# Patient Record
Sex: Male | Born: 1955 | Race: White | Hispanic: No | Smoking: Never smoker
Health system: Southern US, Community
[De-identification: ages and names within clinical notes are randomized; demographics above are authoritative.]

## PROBLEM LIST (undated history)

## (undated) DIAGNOSIS — Z860100 Personal history of colon polyps, unspecified: Secondary | ICD-10-CM

## (undated) DIAGNOSIS — H9319 Tinnitus, unspecified ear: Secondary | ICD-10-CM

## (undated) DIAGNOSIS — F419 Anxiety disorder, unspecified: Secondary | ICD-10-CM

## (undated) DIAGNOSIS — R42 Dizziness and giddiness: Secondary | ICD-10-CM

## (undated) DIAGNOSIS — G039 Meningitis, unspecified: Secondary | ICD-10-CM

## (undated) DIAGNOSIS — T7840XA Allergy, unspecified, initial encounter: Secondary | ICD-10-CM

## (undated) DIAGNOSIS — R450 Nervousness: Secondary | ICD-10-CM

## (undated) DIAGNOSIS — R51 Headache: Secondary | ICD-10-CM

## (undated) DIAGNOSIS — K219 Gastro-esophageal reflux disease without esophagitis: Secondary | ICD-10-CM

## (undated) HISTORY — DX: Tinnitus, unspecified ear: H93.19

## (undated) HISTORY — DX: Dizziness and giddiness: R42

## (undated) HISTORY — DX: Anxiety disorder, unspecified: F41.9

## (undated) HISTORY — DX: Personal history of colonic polyps: Z86.010

## (undated) HISTORY — DX: Personal history of colon polyps, unspecified: Z86.0100

## (undated) HISTORY — DX: Nervousness: R45.0

## (undated) HISTORY — DX: Headache: R51

## (undated) HISTORY — PX: WISDOM TOOTH EXTRACTION: SHX21

## (undated) HISTORY — DX: Allergy, unspecified, initial encounter: T78.40XA

## (undated) HISTORY — PX: POLYPECTOMY: SHX149

## (undated) HISTORY — DX: Meningitis, unspecified: G03.9

---

## 2003-01-27 ENCOUNTER — Encounter (INDEPENDENT_AMBULATORY_CARE_PROVIDER_SITE_OTHER): Payer: Self-pay | Admitting: Specialist

## 2003-01-27 ENCOUNTER — Ambulatory Visit (HOSPITAL_COMMUNITY): Admission: RE | Admit: 2003-01-27 | Discharge: 2003-01-27 | Payer: Self-pay | Admitting: Gastroenterology

## 2005-01-22 ENCOUNTER — Ambulatory Visit: Payer: Self-pay | Admitting: Gastroenterology

## 2005-02-04 ENCOUNTER — Ambulatory Visit: Payer: Self-pay | Admitting: Gastroenterology

## 2005-02-04 ENCOUNTER — Encounter: Payer: Self-pay | Admitting: Family Medicine

## 2007-01-27 ENCOUNTER — Encounter: Admission: RE | Admit: 2007-01-27 | Discharge: 2007-01-27 | Payer: Self-pay | Admitting: Orthopedic Surgery

## 2007-03-08 ENCOUNTER — Telehealth: Payer: Self-pay | Admitting: Family Medicine

## 2007-03-10 ENCOUNTER — Ambulatory Visit: Payer: Self-pay | Admitting: Family Medicine

## 2007-03-10 DIAGNOSIS — R51 Headache: Secondary | ICD-10-CM

## 2007-03-10 DIAGNOSIS — R1031 Right lower quadrant pain: Secondary | ICD-10-CM | POA: Insufficient documentation

## 2007-03-10 DIAGNOSIS — Z860100 Personal history of colon polyps, unspecified: Secondary | ICD-10-CM | POA: Insufficient documentation

## 2007-03-10 DIAGNOSIS — R519 Headache, unspecified: Secondary | ICD-10-CM | POA: Insufficient documentation

## 2007-03-12 ENCOUNTER — Encounter: Payer: Self-pay | Admitting: Family Medicine

## 2007-04-05 ENCOUNTER — Encounter: Admission: RE | Admit: 2007-04-05 | Discharge: 2007-04-05 | Payer: Self-pay | Admitting: General Surgery

## 2007-08-16 ENCOUNTER — Telehealth: Payer: Self-pay | Admitting: Family Medicine

## 2008-07-20 ENCOUNTER — Encounter: Payer: Self-pay | Admitting: Family Medicine

## 2009-01-24 ENCOUNTER — Ambulatory Visit: Payer: Self-pay | Admitting: Family Medicine

## 2009-01-24 DIAGNOSIS — N529 Male erectile dysfunction, unspecified: Secondary | ICD-10-CM | POA: Insufficient documentation

## 2009-08-06 ENCOUNTER — Ambulatory Visit: Payer: Self-pay | Admitting: Family Medicine

## 2009-08-06 DIAGNOSIS — J019 Acute sinusitis, unspecified: Secondary | ICD-10-CM | POA: Insufficient documentation

## 2009-12-19 ENCOUNTER — Encounter: Payer: Self-pay | Admitting: Gastroenterology

## 2009-12-25 ENCOUNTER — Encounter (INDEPENDENT_AMBULATORY_CARE_PROVIDER_SITE_OTHER): Payer: Self-pay | Admitting: *Deleted

## 2010-01-24 LAB — HM COLONOSCOPY

## 2010-01-29 ENCOUNTER — Encounter (INDEPENDENT_AMBULATORY_CARE_PROVIDER_SITE_OTHER): Payer: Self-pay | Admitting: *Deleted

## 2010-01-30 ENCOUNTER — Ambulatory Visit: Payer: Self-pay | Admitting: Internal Medicine

## 2010-02-14 ENCOUNTER — Ambulatory Visit: Payer: Self-pay | Admitting: Internal Medicine

## 2010-02-16 ENCOUNTER — Encounter: Payer: Self-pay | Admitting: Internal Medicine

## 2010-02-18 ENCOUNTER — Telehealth (INDEPENDENT_AMBULATORY_CARE_PROVIDER_SITE_OTHER): Payer: Self-pay | Admitting: *Deleted

## 2010-06-25 NOTE — Letter (Signed)
Summary: Patient Notice- Polyp Results  [facility]  [address] [place], [place] [address]   Phone: [phone]  Fax: [phone]        February 16, 2010 MRN: [id number]    [facility] [address] [place], [place]  [address]    Dear Mr. [facility],  I am pleased to inform you that the colon polyp(s) removed during your recent colonoscopy was (were) found to be benign (no cancer detected) upon pathologic examination.The polyp was hyperplastic ( not precancerous)  I recommend you have a repeat colonoscopy examination in 5_ years to look for recurrent polyps, as having colon polyps increases your risk for having recurrent polyps or even colon cancer in the future.  Should you develop new or worsening symptoms of abdominal pain, bowel habit changes or bleeding from the rectum or bowels, please schedule an evaluation with either your primary care physician or with me.  Additional information/recommendations:  _x_ No further action with gastroenterology is needed at this time. Please      follow-up with your primary care physician for your other healthcare      needs.  __ Please call [phone] to schedule a return visit to review your      situation.  __ Please keep your follow-up visit as already scheduled.  __ Continue treatment plan as outlined the day of your exam.  Please call us if you are having persistent problems or have questions about your condition that have not been fully answered at this time.  Sincerely,  Hart Carwin MD  This letter has been electronically signed by your physician.  Appended Document: Patient Notice- Polyp Results letter mailed

## 2010-06-25 NOTE — Procedures (Signed)
Summary: Colonoscopy  Patient: Wesley [name] Note: All result statuses are Final unless otherwise noted.  Tests: (1) Colonoscopy (COL)   COL Colonoscopy           DONE     [facility]     [address]     [place], [place]  [address]           COLONOSCOPY PROCEDURE REPORT           PATIENT:  Wesley [name], Wesley [name]  MR#:  [id number]     BIRTHDATE:  10/08/1955, 54 yrs. old  GENDER:  male     ENDOSCOPIST:  Hedwig Morton. Juanda Chance, MD     REF. BY:  Tera Mater. Clent Ridges, M.D.     PROCEDURE DATE:  02/14/2010     PROCEDURE:  Colonoscopy [address]     ASA CLASS:  Class I     INDICATIONS:  Elevated Risk Screening father with colon cancer     tubovillous adenoma 2004     MEDICATIONS:   Versed 10 mg, Fentanyl 100 mcg           DESCRIPTION OF PROCEDURE:   After the risks benefits and     alternatives of the procedure were thoroughly explained, informed     consent was obtained.  Digital rectal exam was performed and     revealed no rectal masses.   The [id number] endoscope was     introduced through the anus and advanced to the cecum, which was     identified by both the appendix and ileocecal valve, without     limitations.  The quality of the prep was good, using MiraLax.     The instrument was then slowly withdrawn as the colon was fully     examined.     <<PROCEDUREIMAGES>>     FINDINGS:  Two polyps were found. 10 mm sessile polyp outside of     the cecal pouch snared, 3 mm diminutive polyp at 40 cm The polyp     was removed using cold biopsy forceps. Polyp was snared without     cautery. Retrieval was successful (see image5, image6, image8, and     image9). snare polyp  This was otherwise a normal examination of     the colon (see image2, image3, image4, and image10).  Mild     diverticulosis was found (see image1). few scattered dioverticuli     Retroflexed views in the rectum revealed no abnormaliti     es.    The scope was then withdrawn from the patient and the     procedure completed.          COMPLICATIONS:  None     ENDOSCOPIC IMPRESSION:     1) Two polyps     2) Otherwise normal examination     3) Mild diverticulosis     RECOMMENDATIONS:     1) Await pathology results     REPEAT EXAM:  In 5 year(s) for.           ______________________________     Hedwig Morton. Juanda Chance, MD           CC:           n.     eSIGNED:   Hedwig Morton. Teren Franckowiak at 02/14/2010 11:16 AM           Honor Loh, [id number]  Note: An exclamation mark (!) indicates a result that was not dispersed into the flowsheet. Document Creation Date: 02/14/2010  11:17 AM _______________________________________________________________________  (1) Order result status: Final Collection or observation date-time: 02/14/2010 11:04 Requested date-time:  Receipt date-time:  Reported date-time:  Referring Physician:   Ordering Physician: Lina Sar [phone]) Specimen Source:  Source: Launa Grill Order Number: [id number] Lab site:   Appended Document: Colonoscopy recall     Procedures Next Due Date:    Colonoscopy: 01/2015

## 2010-06-25 NOTE — Letter (Signed)
Summary: [facility] Instructions  [facility]  [address] [place], [place] [address]   Phone: [phone]  Fax: [phone]       Wesley [name]    09/21/1955    MRN: [id number]       Procedure Day Wesley [name]: Wesley [name]  02/14/10     Arrival Time:  9:30am     Procedure Time: 10:30am     Location of Procedure:                    Juliann Pares  [facility] (4th Floor)    PREPARATION FOR COLONOSCOPY WITH MIRALAX  Starting 5 days prior to your procedure  SATURDAY 09/17  do not eat nuts, seeds, popcorn, corn, beans, peas,  salads, or any raw vegetables.  Do not take any fiber supplements (e.g. Metamucil, Citrucel, and Benefiber). ____________________________________________________________________________________________________   THE DAY BEFORE YOUR PROCEDURE         DATE:  [facility]  09/21  1   Drink clear liquids the entire day-NO SOLID FOOD  2   Do not drink anything colored red or purple.  Avoid juices with pulp.  No orange juice.  3   Drink at least 64 oz. (8 glasses) of fluid/clear liquids during the day to prevent dehydration and help the prep work efficiently.  CLEAR LIQUIDS INCLUDE: Water Jello Ice Popsicles Tea (sugar ok, no milk/cream) Powdered fruit flavored drinks Coffee (sugar ok, no milk/cream) Gatorade Juice: apple, white grape, white cranberry  Lemonade Clear bullion, consomm, broth Carbonated beverages (any kind) Strained chicken noodle soup Hard Candy  4   Mix the entire bottle of Miralax with 64 oz. of Gatorade/Powerade in the morning and put in the refrigerator to chill.  5   At 3:00 pm take 2 Dulcolax/Bisacodyl tablets.  6   At 4:30 pm take one Reglan/Metoclopramide tablet.  7  Starting at 5:00 pm drink one 8 oz glass of the Miralax mixture every 15-20 minutes until you have finished drinking the entire 64 oz.  You should finish drinking prep around 7:30 or 8:00 pm.  8   If you are nauseated, you may take the 2nd  Reglan/Metoclopramide tablet at 6:30 pm.        9    At 8:00 pm take 2 more DULCOLAX/Bisacodyl tablets.     THE DAY OF YOUR PROCEDURE      DATE:  Wesley [name]  09/22  You may drink clear liquids until  8:30am   (2 HOURS BEFORE PROCEDURE).   MEDICATION INSTRUCTIONS  Unless otherwise instructed, you should take regular prescription medications with a small sip of water as early as possible the morning of your procedure.           OTHER INSTRUCTIONS  You will need a responsible adult at least 53 years of age to accompany you and drive you home.   This person must remain in the waiting room during your procedure.  Wear loose fitting clothing that is easily removed.  Leave jewelry and other valuables at home.  However, you may wish to bring a book to read or an iPod/MP3 player to listen to music as you wait for your procedure to start.  Remove all body piercing jewelry and leave at home.  Total time from sign-in until discharge is approximately 2-3 hours.  You should go home directly after your procedure and rest.  You can resume normal activities the day after your procedure.  The day of your procedure you should  not:   Drive   Make legal decisions   Operate machinery   Drink alcohol   Return to work  You will receive specific instructions about eating, activities and medications before you leave.   The above instructions have been reviewed and explained to me by   Ezra Sites RN  January 30, 2010 1:46 PM     I fully understand and can verbalize these instructions _____________________________ Date _______

## 2010-06-25 NOTE — Letter (Signed)
Summary: Colonoscopy Letter  [facility]  [address] [place], [place] [address]   Phone: [phone]  Fax: [phone]      December 19, 2009 MRN: [id number]   [facility] [address] [place], [place]  [address]   Dear Mr. [facility],   According to your medical record, it is time for you to schedule a Colonoscopy. The American Cancer Society recommends this procedure as a method to detect early colon cancer. Patients with a family history of colon cancer, or a personal history of colon polyps or inflammatory bowel disease are at increased risk.  This letter has been generated based on the recommendations made at the time of your procedure. If you feel that in your particular situation this may no longer apply, please contact our office.  Please call our office at [phone] to schedule this appointment or to update your records at your earliest convenience.  Thank you for cooperating with [place] to provide you with the very best care possible.   Sincerely,  Judie Petit T. Russella Dar, M.D.  [facility]astro[facility]ology [facility] [phone]

## 2010-06-25 NOTE — Procedures (Signed)
Summary: Colonoscopy/[facility]  Colonoscopy/[facility]   Imported By: Sherian Rein 11/03/2009 09:32:11  _____________________________________________________________________  External Attachment:    Type:   Image     Comment:   External Document

## 2010-06-25 NOTE — Progress Notes (Signed)
Summary: Flu vaccination  Phone Note Other Incoming        Immunization History:  Influenza Immunization History:    Influenza:  historical (02/15/2010)

## 2010-06-25 NOTE — Letter (Signed)
Summary: Previsit letter  [facility]  [address] [place], [place] [address]   Phone: [phone]  Fax: [phone]       12/25/2009 MRN: [id number]  [facility] [address] [place], [place]  [address]  Dear Mr. [facility],  Welcome to the Gastroenterology Division at [facility].    You are scheduled to see a nurse for your pre-procedure visit on 01-30-10 at 1:30p.m. on the 3rd floor at [facility], [address]  We ask that you try to arrive at our office 15 minutes prior to your appointment time to allow for check-in.  Your nurse visit will consist of discussing your medical and surgical history, your immediate family medical history, and your medications.    Please bring a complete list of all your medications or, if you prefer, bring the medication bottles and we will list them.  We will need to be aware of both prescribed and over the counter drugs.  We will need to know exact dosage information as well.  If you are on blood thinners (Coumadin, Plavix, Aggrenox, Ticlid, etc.) please call our office today/prior to your appointment, as we need to consult with your physician about holding your medication.   Please be prepared to read and sign documents such as consent forms, a financial agreement, and acknowledgement forms.  If necessary, and with your consent, a friend or relative is welcome to sit-in on the nurse visit with you.  Please bring your insurance card so that we may make a copy of it.  If your insurance requires a referral to see a specialist, please bring your referral form from your primary care physician.  No co-pay is required for this nurse visit.     If you cannot keep your appointment, please call [phone] to cancel or reschedule prior to your appointment date.  This allows [place] the opportunity to schedule an appointment for another patient in need of care.    Thank you for choosing [facility] for your medical needs.  We  appreciate the opportunity to care for you.  Please visit [place] at our website  to learn more about our practice.                     Sincerely.                                                                                                                   The Gastroenterology Division

## 2010-06-25 NOTE — Assessment & Plan Note (Signed)
Summary: ?SINUS INF/NJR   Vital Signs:  Patient profile:   55 year old male Weight:      182 pounds BMI:     24.77 Temp:     98.7 degrees F oral BP sitting:   120 / 76  (left arm) Cuff size:   regular  Vitals Entered By: Raechel Ache, RN (August 06, 2009 3:45 PM) CC: C/o sinus congestion x 6 weeks.   History of Present Illness: Here for 6 weeks of mild stuffiness in the sinuses and some PND. No fever or St or cough. He has never had allergy problems before.   Allergies (verified): No Known Drug Allergies  Past History:  Past Medical History: Reviewed history from 03/10/2007 and no changes required. Colonic polyps, hx of Headache  Past Surgical History: Reviewed history from 03/10/2007 and no changes required. colonoscopy 2004, benign polyps per Dr. Corinda Gubler colonoscopy 2006, clear, to repeat in 5 years  Review of Systems  The patient denies anorexia, fever, weight loss, weight gain, vision loss, decreased hearing, hoarseness, chest pain, syncope, dyspnea on exertion, peripheral edema, prolonged cough, headaches, hemoptysis, abdominal pain, melena, hematochezia, severe indigestion/heartburn, hematuria, incontinence, genital sores, muscle weakness, suspicious skin lesions, transient blindness, difficulty walking, depression, unusual weight change, abnormal bleeding, enlarged lymph nodes, angioedema, breast masses, and testicular masses.    Physical Exam  General:  Well-developed,well-nourished,in no acute distress; alert,appropriate and cooperative throughout examination Head:  Normocephalic and atraumatic without obvious abnormalities. No apparent alopecia or balding. Eyes:  No corneal or conjunctival inflammation noted. EOMI. Perrla. Funduscopic exam benign, without hemorrhages, exudates or papilledema. Vision grossly normal. Ears:  External ear exam shows no significant lesions or deformities.  Otoscopic examination reveals clear canals, tympanic membranes are intact  bilaterally without bulging, retraction, inflammation or discharge. Hearing is grossly normal bilaterally. Nose:  External nasal examination shows no deformity or inflammation. Nasal mucosa are pink and moist without lesions or exudates. Mouth:  Oral mucosa and oropharynx without lesions or exudates.  Teeth in good repair. Neck:  No deformities, masses, or tenderness noted. Lungs:  Normal respiratory effort, chest expands symmetrically. Lungs are clear to auscultation, no crackles or wheezes.   Impression & Recommendations:  Problem # 1:  ACUTE SINUSITIS, UNSPECIFIED (ICD-461.9)  His updated medication list for this problem includes:    Zithromax Z-pak 250 Mg Tabs (Azithromycin) .[name]... As directed  Complete Medication List: 1)  Viagra 100 Mg Tabs (Sildenafil citrate) .... As directed 2)  Viagra 100 Mg Tabs (Sildenafil citrate) .... As needed 3)  Zithromax Z-pak 250 Mg Tabs (Azithromycin) .... As directed  Patient Instructions: 1)  Add Claritin D as needed . Prescriptions: ZITHROMAX Z-PAK 250 MG TABS (AZITHROMYCIN) as directed  #1 x 0   Entered and Authorized by:   Nelwyn Salisbury MD   Signed by:   Nelwyn Salisbury MD on 08/06/2009   Method used:   Electronically to        [facility]* (retail)       [address]       [place], [place]  [address]       Ph: [phone]       Fax: [phone]   RxID:   [id number]

## 2010-06-25 NOTE — Miscellaneous (Signed)
Summary: [facility]  Clinical Lists Changes  Medications: Added new medication of MIRALAX   POWD (POLYETHYLENE GLYCOL 3350) As per prep  instructions. - Signed Added new medication of REGLAN 10 MG  TABS (METOCLOPRAMIDE HCL) As per prep instructions. - Signed Added new medication of DULCOLAX 5 MG  TBEC (BISACODYL) Day before procedure take 2 at 3pm and 2 at 8pm. - Signed Rx of MIRALAX   POWD (POLYETHYLENE GLYCOL 3350) As per prep  instructions.;  #255gm x 0;  Signed;  Entered by: Ezra Sites RN;  Authorized by: Hart Carwin MD;  Method used: Electronically to [facility]*, [address] [place], [place]  [address], Ph: [phone], Fax: [phone] Rx of REGLAN 10 MG  TABS (METOCLOPRAMIDE HCL) As per prep instructions.;  #2 x 0;  Signed;  Entered by: Ezra Sites RN;  Authorized by: Hart Carwin MD;  Method used: Electronically to [facility]*, [address], [place], [place]  [address], Ph: [phone], Fax: [phone] Rx of DULCOLAX 5 MG  TBEC (BISACODYL) Day before procedure take 2 at 3pm and 2 at 8pm.;  #4 x 0;  Signed;  Entered by: Ezra Sites RN;  Authorized by: Hart Carwin MD;  Method used: Electronically to [facility]*, [address] [place], [place]  [address], Ph: [phone], Fax: [phone] Observations: Added new observation of NKA: T (01/30/2010 13:26)    Prescriptions: DULCOLAX 5 MG  TBEC (BISACODYL) Day before procedure take 2 at 3pm and 2 at 8pm.  #4 x 0   Entered by:   Ezra Sites RN   Authorized by:   Hart Carwin MD   Signed by:   Ezra Sites RN on 01/30/2010   Method used:   Electronically to        [facility]* (retail)       [address]       [place], [place]  [address]       Ph: [phone]       Fax: [phone]   RxID:   [id number] REGLAN 10 MG  TABS (METOCLOPRAMIDE HCL) As per prep instructions.  #2 x 0   Entered by:   Ezra Sites RN   Authorized by:   Hart Carwin MD   Signed by:    Ezra Sites RN on 01/30/2010   Method used:   Electronically to        [facility]* (retail)       [address]       [place], [place]  [address]       Ph: [phone]       Fax: [phone]   RxID:   [id number] MIRALAX   POWD (POLYETHYLENE GLYCOL 3350) As per prep  instructions.  #255gm x 0   Entered by:   Ezra Sites RN   Authorized by:   Hart Carwin MD   Signed by:   Ezra Sites RN on 01/30/2010   Method used:   Electronically to        [facility]* (retail)       [address]       [place], [place]  [address]       Ph: [phone]       Fax: [phone]   RxID:   [id number]

## 2010-07-04 ENCOUNTER — Emergency Department (HOSPITAL_COMMUNITY)
Admission: EM | Admit: 2010-07-04 | Discharge: 2010-07-04 | Disposition: A | Discharge status: Home / Self Care | Payer: BC Managed Care – PPO | Attending: Emergency Medicine | Admitting: Emergency Medicine

## 2010-07-04 DIAGNOSIS — G43909 Migraine, unspecified, not intractable, without status migrainosus: Secondary | ICD-10-CM | POA: Insufficient documentation

## 2010-08-15 ENCOUNTER — Encounter: Payer: Self-pay | Admitting: Family Medicine

## 2010-08-16 ENCOUNTER — Ambulatory Visit (INDEPENDENT_AMBULATORY_CARE_PROVIDER_SITE_OTHER): Payer: BC Managed Care – PPO | Admitting: Family Medicine

## 2010-08-16 ENCOUNTER — Encounter: Payer: Self-pay | Admitting: Family Medicine

## 2010-08-16 VITALS — BP 110/72 | HR 62 | Temp 98.7°F

## 2010-08-16 DIAGNOSIS — R591 Generalized enlarged lymph nodes: Secondary | ICD-10-CM

## 2010-08-16 DIAGNOSIS — R599 Enlarged lymph nodes, unspecified: Secondary | ICD-10-CM

## 2010-08-16 LAB — CBC WITH DIFFERENTIAL/PLATELET
Basophils Absolute: 0.1 10*3/uL (ref 0.0–0.1)
Eosinophils Relative: 7.8 % — ABNORMAL HIGH (ref 0.0–5.0)
HCT: 45.1 % (ref 39.0–52.0)
Hemoglobin: 15.4 g/dL (ref 13.0–17.0)
Lymphocytes Relative: 29.7 % (ref 12.0–46.0)
MCHC: 34.1 g/dL (ref 30.0–36.0)
Monocytes Relative: 8.7 % (ref 3.0–12.0)
Neutro Abs: 3.9 10*3/uL (ref 1.4–7.7)
Platelets: 266 10*3/uL (ref 150.0–400.0)
RBC: 4.94 Mil/uL (ref 4.22–5.81)
RDW: 13.5 % (ref 11.5–14.6)
WBC: 7.3 10*3/uL (ref 4.5–10.5)

## 2010-08-16 MED ORDER — AMOXICILLIN-POT CLAVULANATE 875-125 MG PO TABS: 1.0000 | ORAL_TABLET | Freq: Two times a day (BID) | ORAL | Status: AC

## 2010-08-16 NOTE — Progress Notes (Signed)
  Subjective:    Patient ID: Wesley [name], male    DOB: Oct 25, 1955, 55 y.o.   MRN: [id number]  HPI Here to look at an asymptomatic lump in the left armpit that came up one week ago. It does not bother him at all. No recent hx of trauma to the left arm, no scratches or animal bites, etc. He feels fine, no sweats or fevers.    Review of Systems  Constitutional: Negative.   HENT: Negative.   Respiratory: Negative.   Cardiovascular: Negative.   Gastrointestinal: Negative.   Skin: Negative.   Hematological: Positive for adenopathy.       Objective:   Physical Exam  Constitutional: He appears well-developed and well-nourished.  Musculoskeletal:       There is a single nontender mobile lymph node in the anterior left axilla measuring about 1.5 cm in diameter. No other nodes are found anywhere, including the neck, supraclavicular areas, and groins.           Assessment & Plan:  Single enlarged lymph node. I explained that these are usually reactive and benign, but we need to watch it closely. Check a CBC today and treat with 10 days of antibiotics. Recheck in 2 weeks

## 2010-08-19 ENCOUNTER — Telehealth: Payer: Self-pay

## 2010-08-19 NOTE — Telephone Encounter (Signed)
Message copied by Madison Hickman on Mon Aug 19, 2010 10:06 AM ------      Message from: Dwaine Deter      Created: Fri Aug 16, 2010  5:04 PM       normal

## 2010-08-19 NOTE — Telephone Encounter (Signed)
Pt aware lab results

## 2010-08-23 ENCOUNTER — Other Ambulatory Visit: Payer: Self-pay | Admitting: Dermatology

## 2010-08-26 ENCOUNTER — Ambulatory Visit (INDEPENDENT_AMBULATORY_CARE_PROVIDER_SITE_OTHER): Payer: BC Managed Care – PPO | Admitting: Family Medicine

## 2010-08-26 ENCOUNTER — Encounter: Payer: Self-pay | Admitting: Family Medicine

## 2010-08-26 VITALS — BP 110/64 | HR 98 | Temp 98.5°F

## 2010-08-26 DIAGNOSIS — R591 Generalized enlarged lymph nodes: Secondary | ICD-10-CM

## 2010-08-26 DIAGNOSIS — R599 Enlarged lymph nodes, unspecified: Secondary | ICD-10-CM

## 2010-08-26 NOTE — Progress Notes (Signed)
  Subjective:    Patient ID: Wesley [name], male    DOB: November 23, 1955, 55 y.o.   MRN: [id number]  HPI Here to recheck an enlarged lymph node in the left axilla. We saw this 2 weeks ago, and he had a normal CBC with a WBC of 7.3. We gave him Augmentin for 10 days, but this has not responded at all. It is still not symptomatic.    Review of Systems  Constitutional: Negative.   Hematological: Positive for adenopathy.       Objective:   Physical Exam  Constitutional: He appears well-developed and well-nourished.  Musculoskeletal:       Non-tender firm round mass in the left axilla          Assessment & Plan:  This needs to be biopsied or removed, so we will send him to Surgery soon.

## 2010-09-25 ENCOUNTER — Other Ambulatory Visit: Payer: Self-pay | Admitting: Pulmonary Disease

## 2010-09-27 ENCOUNTER — Other Ambulatory Visit: Payer: Self-pay | Admitting: Pulmonary Disease

## 2010-09-27 ENCOUNTER — Ambulatory Visit
Admission: RE | Admit: 2010-09-27 | Discharge: 2010-09-27 | Disposition: A | Discharge status: Home / Self Care | Payer: BC Managed Care – PPO | Source: Ambulatory Visit | Attending: Pulmonary Disease | Admitting: Pulmonary Disease

## 2010-10-25 ENCOUNTER — Telehealth: Payer: Self-pay | Admitting: Family Medicine

## 2010-10-25 NOTE — Telephone Encounter (Signed)
I spoke with patient and he is going to call Dr Cornett's office about MRI. FYI.

## 2010-10-25 NOTE — Telephone Encounter (Signed)
Pt called req to get an order to have an MRI done on lft arm, because a CT scan was done at [facility] clinic for mass in arm. Pt req to get this done asap today if possible.

## 2010-10-25 NOTE — Telephone Encounter (Signed)
I don't understand this request. I sent him to Dr. Luisa Hart in April about the mass under the left arm, and he recommended observing this for a few more weeks. If not better then, he said he would consider removing it. I have not heard from any other doctors, and now the pt says he had a CT at the [facility]? I can't go ordering scans when I  don't know what is going on

## 2011-11-07 ENCOUNTER — Emergency Department (HOSPITAL_COMMUNITY)
Admission: EM | Admit: 2011-11-07 | Discharge: 2011-11-07 | Disposition: A | Discharge status: Home / Self Care | Payer: BC Managed Care – PPO | Attending: Emergency Medicine | Admitting: Emergency Medicine

## 2011-11-07 ENCOUNTER — Encounter (HOSPITAL_COMMUNITY): Payer: Self-pay | Admitting: Emergency Medicine

## 2011-11-07 DIAGNOSIS — R112 Nausea with vomiting, unspecified: Secondary | ICD-10-CM | POA: Insufficient documentation

## 2011-11-07 DIAGNOSIS — I498 Other specified cardiac arrhythmias: Secondary | ICD-10-CM | POA: Insufficient documentation

## 2011-11-07 DIAGNOSIS — H8110 Benign paroxysmal vertigo, unspecified ear: Secondary | ICD-10-CM | POA: Insufficient documentation

## 2011-11-07 LAB — URINALYSIS, ROUTINE W REFLEX MICROSCOPIC
Bilirubin Urine: NEGATIVE
Leukocytes, UA: NEGATIVE
Protein, ur: NEGATIVE mg/dL
Specific Gravity, Urine: 1.021 (ref 1.005–1.030)
pH: 7 (ref 5.0–8.0)

## 2011-11-07 LAB — DIFFERENTIAL
Basophils Absolute: 0 10*3/uL (ref 0.0–0.1)
Basophils Relative: 0 % (ref 0–1)
Eosinophils Absolute: 0.4 10*3/uL (ref 0.0–0.7)
Eosinophils Relative: 6 % — ABNORMAL HIGH (ref 0–5)
Lymphocytes Relative: 16 % (ref 12–46)
Lymphs Abs: 1.2 10*3/uL (ref 0.7–4.0)
Monocytes Absolute: 0.5 10*3/uL (ref 0.1–1.0)
Monocytes Relative: 7 % (ref 3–12)
Neutrophils Relative %: 71 % (ref 43–77)

## 2011-11-07 LAB — BASIC METABOLIC PANEL
Chloride: 106 mEq/L (ref 96–112)
Creatinine, Ser: 0.99 mg/dL (ref 0.50–1.35)
GFR calc non Af Amer: >90 mL/min (ref >90)
Potassium: 3.7 mEq/L (ref 3.5–5.1)
Sodium: 139 mEq/L (ref 135–145)

## 2011-11-07 LAB — CBC: RDW: 13 % (ref 11.5–15.5)

## 2011-11-07 MED ORDER — PROMETHAZINE HCL 25 MG PO TABS: 25.0000 mg | ORAL_TABLET | Freq: Four times a day (QID) | ORAL | Status: DC | PRN

## 2011-11-07 MED ORDER — MECLIZINE HCL 50 MG PO TABS: 50.0000 mg | ORAL_TABLET | Freq: Three times a day (TID) | ORAL | Status: AC | PRN

## 2011-11-07 MED ORDER — MECLIZINE HCL 25 MG PO TABS
50.0000 mg | ORAL_TABLET | Freq: Once | ORAL | Status: AC
  Administered 2011-11-07: 50 mg via ORAL
  Filled 2011-11-07: qty 2

## 2011-11-07 MED ORDER — DIAZEPAM 5 MG/ML IJ SOLN
5.0000 mg | Freq: Once | INTRAMUSCULAR | Status: AC
  Administered 2011-11-07: 5 mg via INTRAVENOUS
  Filled 2011-11-07: qty 2

## 2011-11-07 MED ORDER — ONDANSETRON HCL 4 MG/2ML IJ SOLN
INTRAMUSCULAR | Status: AC
  Administered 2011-11-07: 4 mg
  Filled 2011-11-07: qty 2

## 2011-11-07 MED ORDER — ONDANSETRON HCL 4 MG/2ML IJ SOLN
4.0000 mg | Freq: Once | INTRAMUSCULAR | Status: DC
  Filled 2011-11-07: qty 2

## 2011-11-07 MED ORDER — DIAZEPAM 5 MG PO TABS: 5.0000 mg | ORAL_TABLET | Freq: Two times a day (BID) | ORAL | Status: AC

## 2011-11-07 MED ORDER — PROMETHAZINE HCL 25 MG/ML IJ SOLN
25.0000 mg | Freq: Once | INTRAMUSCULAR | Status: AC
  Administered 2011-11-07: 25 mg via INTRAMUSCULAR
  Filled 2011-11-07: qty 1

## 2011-11-07 MED ORDER — SODIUM CHLORIDE 0.9 % IV BOLUS (SEPSIS)
1000.0000 mL | Freq: Once | INTRAVENOUS | Status: AC
  Administered 2011-11-07: 1000 mL via INTRAVENOUS

## 2011-11-07 NOTE — ED Notes (Signed)
Patient given a urinal and has been told that we need a urine specimen. He is unable to go at this time

## 2011-11-07 NOTE — ED Notes (Signed)
EMS brings pt from home pt co of dizziness and nausea starting this am upon waking at 8 am, pt has a history of vertigo and ear pain x 2 days prior. Pt received zofran 4mg  IV enroute to hospital.

## 2011-11-07 NOTE — ED Provider Notes (Signed)
History     CSN: [id number]  Arrival date & time 11/07/11  0908   First MD Initiated Contact with Patient 11/07/11 0920      Chief Complaint  Patient presents with  . Dizziness    (Consider location/radiation/quality/duration/timing/severity/associated sxs/prior treatment) HPI Comments: Patient has a history of vertigo. He developed acute onset severe vertigo with associated nausea and vomiting upon awakening at 11 AM. He has had ear pain and intermittent tinnitus over the past 2 days. He has also been battling a chronic sinus infection. He states he has not attempted to walk due to the severity of his vertigo. He's had multiple episodes of emesis. He states this is more severe than it has been in the past. Denies headache, chest pain, shortness of breath, weakness, paresthesias, vision changes.  The history is provided by the patient. No language interpreter was used.    Past Medical History  Diagnosis Date  . Hx of colonic polyps   . Headache     History reviewed. No pertinent past surgical history.  Family History  Problem Relation Age of Onset  . Arthritis      family hx  . Cancer      colon/family hx  . Kidney disease      family hx  . Cancer Father     non-Hodgkins lymphoma    History  Substance Use Topics  . Smoking status: Never Smoker   . Smokeless tobacco: Not on file  . Alcohol Use: Yes      Review of Systems  Constitutional: Negative for fever, chills, activity change, appetite change and fatigue.  HENT: Positive for tinnitus. Negative for hearing loss, ear pain, congestion, sore throat, rhinorrhea, neck pain and neck stiffness.   Respiratory: Negative for cough and shortness of breath.   Cardiovascular: Negative for chest pain and palpitations.  Gastrointestinal: Positive for nausea and vomiting. Negative for abdominal pain, diarrhea and constipation.  Genitourinary: Negative for dysuria, urgency, frequency and flank pain.  Musculoskeletal:  Negative for myalgias, back pain and arthralgias.  Neurological: Positive for dizziness. Negative for weakness, light-headedness, numbness and headaches.  All other systems reviewed and are negative.    Allergies  Review of patient's allergies indicates no known allergies.  Home Medications   Current Outpatient Rx  Name Route Sig Dispense Refill  . MOMETASONE FUROATE 50 MCG/ACT NA SUSP Nasal 2 sprays by Nasal route daily.      [name] PATANASE NA Nasal by Nasal route. Per Dr Stevphen Rochester     . DIAZEPAM 5 MG PO TABS Oral Take 1 tablet (5 mg total) by mouth 2 (two) times daily. 20 tablet 0  . MECLIZINE HCL 50 MG PO TABS Oral Take 1 tablet (50 mg total) by mouth 3 (three) times daily as needed. 30 tablet 0  . PROMETHAZINE HCL 25 MG PO TABS Oral Take 1 tablet (25 mg total) by mouth every 6 (six) hours as needed for nausea. 20 tablet 0  . SILDENAFIL CITRATE 100 MG PO TABS Oral Take 100 mg by mouth daily as needed.        BP 117/73  Pulse 64  Temp 96.2 F (35.7 C) (Rectal)  Resp 20  SpO2 100%  Physical Exam  Nursing note and vitals reviewed. Constitutional: He is oriented to person, place, and time. He appears well-developed and well-nourished.       Appears uncomfortable  HENT:  Head: Normocephalic and atraumatic.  Right Ear: External ear normal.  Left Ear: External ear normal.  Mouth/Throat: Oropharynx is clear and moist.  Eyes: Conjunctivae are normal. Pupils are equal, round, and reactive to light. Right eye exhibits nystagmus. Left eye exhibits nystagmus.       Nystagmus with manipulation of the head  Neck: Normal range of motion. Neck supple.  Cardiovascular: Regular rhythm, normal heart sounds and intact distal pulses.  Exam reveals no gallop and no friction rub.   No murmur heard.      Bradycardic rate  Pulmonary/Chest: Effort normal and breath sounds normal. No respiratory distress. He exhibits no tenderness.  Abdominal: Soft. Bowel sounds are normal. There is no  tenderness. There is no rebound and no guarding.  Musculoskeletal: Normal range of motion. He exhibits no edema and no tenderness.  Neurological: He is alert and oriented to person, place, and time. He has normal strength. No cranial nerve deficit or sensory deficit. Coordination normal.    ED Course  Procedures (including critical care time)  Labs Reviewed  DIFFERENTIAL - Abnormal; Notable for the following:    Eosinophils Relative 6 (*)     All other components within normal limits  BASIC METABOLIC PANEL - Abnormal; Notable for the following:    Glucose, Bld 143 (*)     All other components within normal limits  CBC  URINALYSIS, ROUTINE W REFLEX MICROSCOPIC   No results found.   1. Benign paroxysmal positional vertigo       MDM  Patient with sudden onset of severe vertigo with associated nausea and vomiting. This is consistent with benign positional vertigo. He has no symptoms concerning for a central cause. He has no neurologic findings on examination. He had improvement of his symptoms after Valium, meclizine, Phenergan. There is no indication for imaging at this time. Laboratory studies are unremarkable. He will be discharged home with instructions for followup.        Dayton Bailiff, MD 11/07/11 [phone]

## 2011-11-07 NOTE — ED Notes (Signed)
JXB:[id number]<GN> Expected date:11/07/11<BR> Expected time: 8:55 AM<BR> Means of arrival:Ambulance<BR> Comments:<BR> N/v, hx of vertigo

## 2011-11-07 NOTE — Discharge Instructions (Signed)

## 2011-11-07 NOTE — ED Notes (Signed)
King, MD at bedside.

## 2012-06-08 ENCOUNTER — Other Ambulatory Visit (INDEPENDENT_AMBULATORY_CARE_PROVIDER_SITE_OTHER): Payer: BC Managed Care – PPO

## 2012-06-08 DIAGNOSIS — Z0000 Encounter for general adult medical examination without abnormal findings: Secondary | ICD-10-CM

## 2012-06-08 LAB — LIPID PANEL
HDL: 52.8 mg/dL (ref >39.00)
LDL Cholesterol: 67 mg/dL (ref 0–99)
VLDL: 33.6 mg/dL (ref 0.0–40.0)

## 2012-06-08 LAB — CBC WITH DIFFERENTIAL/PLATELET
Basophils Absolute: 0.1 10*3/uL (ref 0.0–0.1)
Basophils Relative: 0.8 % (ref 0.0–3.0)
HCT: 44.6 % (ref 39.0–52.0)
Hemoglobin: 15.1 g/dL (ref 13.0–17.0)
Lymphocytes Relative: 29.9 % (ref 12.0–46.0)
Lymphs Abs: 2 10*3/uL (ref 0.7–4.0)
MCHC: 34 g/dL (ref 30.0–36.0)
Monocytes Absolute: 0.6 10*3/uL (ref 0.1–1.0)
Monocytes Relative: 9.2 % (ref 3.0–12.0)
Neutro Abs: 3.4 10*3/uL (ref 1.4–7.7)
Neutrophils Relative %: 50.5 % (ref 43.0–77.0)
RBC: 5.06 Mil/uL (ref 4.22–5.81)
RDW: 13.5 % (ref 11.5–14.6)
WBC: 6.8 10*3/uL (ref 4.5–10.5)

## 2012-06-08 LAB — HEPATIC FUNCTION PANEL
Bilirubin, Direct: 0.1 mg/dL (ref 0.0–0.3)
Total Bilirubin: 0.7 mg/dL (ref 0.3–1.2)

## 2012-06-08 LAB — POCT URINALYSIS DIPSTICK
Blood, UA: NEGATIVE
Leukocytes, UA: NEGATIVE
Protein, UA: NEGATIVE
Spec Grav, UA: 1.02
Urobilinogen, UA: 0.2
pH, UA: 5.5

## 2012-06-08 LAB — BASIC METABOLIC PANEL: Potassium: 4.2 mEq/L (ref 3.5–5.1)

## 2012-06-08 LAB — TSH: TSH: 2.46 u[IU]/mL (ref 0.35–5.50)

## 2012-06-08 LAB — PSA: PSA: 1.35 ng/mL (ref 0.10–4.00)

## 2012-06-09 ENCOUNTER — Other Ambulatory Visit: Payer: Self-pay | Admitting: Family Medicine

## 2012-06-14 NOTE — Progress Notes (Signed)
Quick Note:  I left voice message with results. ______ 

## 2012-06-15 ENCOUNTER — Ambulatory Visit (INDEPENDENT_AMBULATORY_CARE_PROVIDER_SITE_OTHER): Payer: BC Managed Care – PPO | Admitting: Family Medicine

## 2012-06-15 ENCOUNTER — Encounter: Payer: Self-pay | Admitting: Family Medicine

## 2012-06-15 VITALS — BP 128/76 | HR 80 | Temp 99.0°F | Ht 71.5 in | Wt 184.0 lb

## 2012-06-15 DIAGNOSIS — R42 Dizziness and giddiness: Secondary | ICD-10-CM | POA: Insufficient documentation

## 2012-06-15 DIAGNOSIS — Z0000 Encounter for general adult medical examination without abnormal findings: Secondary | ICD-10-CM

## 2012-06-15 MED ORDER — SILDENAFIL CITRATE 100 MG PO TABS: 100.0000 mg | ORAL_TABLET | ORAL | Status: DC | PRN

## 2012-06-15 MED ORDER — MOMETASONE FUROATE 50 MCG/ACT NA SUSP: 2.0000 | Freq: Every day | NASAL | Status: DC | PRN

## 2012-06-15 MED ORDER — OLOPATADINE HCL 0.6 % NA SOLN: 2.0000 | Freq: Every day | NASAL | Status: DC

## 2012-06-15 NOTE — Progress Notes (Signed)
  Subjective:    Patient ID: Wesley [name], male    DOB: 1955/12/12, 57 y.o.   MRN: [id number]  HPI 57 yr old male for a cpx. He feels well and has no concerns.    Review of Systems  Constitutional: Negative.   HENT: Negative.   Eyes: Negative.   Respiratory: Negative.   Cardiovascular: Negative.   Gastrointestinal: Negative.   Genitourinary: Negative.   Musculoskeletal: Negative.   Skin: Negative.   Neurological: Negative.   Hematological: Negative.   Psychiatric/Behavioral: Negative.        Objective:   Physical Exam  Constitutional: He is oriented to person, place, and time. He appears well-developed and well-nourished. No distress.  HENT:  Head: Normocephalic and atraumatic.  Right Ear: External ear normal.  Left Ear: External ear normal.  Nose: Nose normal.  Mouth/Throat: Oropharynx is clear and moist. No oropharyngeal exudate.  Eyes: Conjunctivae normal and EOM are normal. Pupils are equal, round, and reactive to light. Right eye exhibits no discharge. Left eye exhibits no discharge. No scleral icterus.  Neck: Neck supple. No JVD present. No tracheal deviation present. No thyromegaly present.  Cardiovascular: Normal rate, regular rhythm, normal heart sounds and intact distal pulses.  Exam reveals no gallop and no friction rub.   No murmur heard. Pulmonary/Chest: Effort normal and breath sounds normal. No respiratory distress. He has no wheezes. He has no rales. He exhibits no tenderness.  Abdominal: Soft. Bowel sounds are normal. He exhibits no distension and no mass. There is no tenderness. There is no rebound and no guarding.  Genitourinary: Rectum normal, prostate normal and penis normal. Guaiac negative stool. No penile tenderness.  Musculoskeletal: Normal range of motion. He exhibits no edema and no tenderness.  Lymphadenopathy:    He has no cervical adenopathy.  Neurological: He is alert and oriented to person, place, and time. He has normal reflexes. No cranial  nerve deficit. He exhibits normal muscle tone. Coordination normal.  Skin: Skin is warm and dry. No rash noted. He is not diaphoretic. No erythema. No pallor.  Psychiatric: He has a normal mood and affect. His behavior is normal. Judgment and thought content normal.          Assessment & Plan:  Well exam.

## 2012-11-24 ENCOUNTER — Institutional Professional Consult (permissible substitution): Payer: BC Managed Care – PPO | Admitting: Cardiovascular Disease

## 2013-01-26 ENCOUNTER — Ambulatory Visit: Payer: BC Managed Care – PPO | Admitting: Cardiovascular Disease

## 2013-04-24 ENCOUNTER — Emergency Department (HOSPITAL_COMMUNITY): Payer: BC Managed Care – PPO

## 2013-04-24 ENCOUNTER — Emergency Department (HOSPITAL_COMMUNITY)
Admission: EM | Admit: 2013-04-24 | Discharge: 2013-04-24 | Disposition: A | Discharge status: Home / Self Care | Payer: BC Managed Care – PPO | Attending: Emergency Medicine | Admitting: Emergency Medicine

## 2013-04-24 ENCOUNTER — Encounter (HOSPITAL_COMMUNITY): Payer: Self-pay | Admitting: Emergency Medicine

## 2013-04-24 DIAGNOSIS — R109 Unspecified abdominal pain: Secondary | ICD-10-CM

## 2013-04-24 DIAGNOSIS — Z860100 Personal history of colon polyps, unspecified: Secondary | ICD-10-CM | POA: Insufficient documentation

## 2013-04-24 DIAGNOSIS — R1084 Generalized abdominal pain: Secondary | ICD-10-CM | POA: Insufficient documentation

## 2013-04-24 LAB — CBC WITH DIFFERENTIAL/PLATELET
Basophils Absolute: 0 10*3/uL (ref 0.0–0.1)
Basophils Relative: 0 % (ref 0–1)
Eosinophils Absolute: 0.3 10*3/uL (ref 0.0–0.7)
Eosinophils Relative: 3 % (ref 0–5)
HCT: 43.9 % (ref 39.0–52.0)
Hemoglobin: 15.4 g/dL (ref 13.0–17.0)
Lymphocytes Relative: 19 % (ref 12–46)
Lymphs Abs: 1.9 10*3/uL (ref 0.7–4.0)
MCH: 30.1 pg (ref 26.0–34.0)
MCHC: 35.1 g/dL (ref 30.0–36.0)
MCV: 85.7 fL (ref 78.0–100.0)
Monocytes Absolute: 0.7 10*3/uL (ref 0.1–1.0)
Monocytes Relative: 7 % (ref 3–12)
Neutro Abs: 7 10*3/uL (ref 1.7–7.7)
Neutrophils Relative %: 71 % (ref 43–77)
Platelets: 226 10*3/uL (ref 150–400)
RBC: 5.12 MIL/uL (ref 4.22–5.81)
RDW: 12.9 % (ref 11.5–15.5)
WBC: 9.9 10*3/uL (ref 4.0–10.5)

## 2013-04-24 LAB — URINALYSIS, ROUTINE W REFLEX MICROSCOPIC
Bilirubin Urine: NEGATIVE
Glucose, UA: NEGATIVE mg/dL
Hgb urine dipstick: NEGATIVE
Ketones, ur: NEGATIVE mg/dL
Leukocytes, UA: NEGATIVE
Nitrite: NEGATIVE
Protein, ur: NEGATIVE mg/dL
Specific Gravity, Urine: 1.016 (ref 1.005–1.030)
Urobilinogen, UA: 0.2 mg/dL (ref 0.0–1.0)
pH: 7.5 (ref 5.0–8.0)

## 2013-04-24 LAB — COMPREHENSIVE METABOLIC PANEL
ALT: 20 U/L (ref 0–53)
AST: 19 U/L (ref 0–37)
Albumin: 4.1 g/dL (ref 3.5–5.2)
Alkaline Phosphatase: 43 U/L (ref 39–117)
BUN: 15 mg/dL (ref 6–23)
CO2: 26 mEq/L (ref 19–32)
Calcium: 9.5 mg/dL (ref 8.4–10.5)
Chloride: 100 mEq/L (ref 96–112)
Creatinine, Ser: 0.98 mg/dL (ref 0.50–1.35)
GFR calc Af Amer: >90 mL/min (ref >90)
GFR calc non Af Amer: >90 mL/min (ref >90)
Glucose, Bld: 109 mg/dL — ABNORMAL HIGH (ref 70–99)
Potassium: 4.2 mEq/L (ref 3.5–5.1)
Sodium: 136 mEq/L (ref 135–145)
Total Bilirubin: 0.7 mg/dL (ref 0.3–1.2)
Total Protein: 6.4 g/dL (ref 6.0–8.3)

## 2013-04-24 LAB — CG4 I-STAT (LACTIC ACID): Lactic Acid, Venous: 0.51 mmol/L (ref 0.5–2.2)

## 2013-04-24 LAB — LIPASE, BLOOD: Lipase: 17 U/L (ref 11–59)

## 2013-04-24 MED ORDER — KETOROLAC TROMETHAMINE 15 MG/ML IJ SOLN
15.0000 mg | Freq: Once | INTRAMUSCULAR | Status: AC
  Administered 2013-04-24: 15 mg via INTRAVENOUS
  Filled 2013-04-24: qty 1

## 2013-04-24 MED ORDER — HYDROMORPHONE HCL PF 1 MG/ML IJ SOLN
1.0000 mg | Freq: Once | INTRAMUSCULAR | Status: AC
  Administered 2013-04-24: 1 mg via INTRAVENOUS
  Filled 2013-04-24: qty 1

## 2013-04-24 MED ORDER — OXYCODONE-ACETAMINOPHEN 5-325 MG PO TABS: 1.0000 | ORAL_TABLET | ORAL | Status: DC | PRN

## 2013-04-24 MED ORDER — FAMOTIDINE IN NACL 20-0.9 MG/50ML-% IV SOLN
20.0000 mg | Freq: Once | INTRAVENOUS | Status: AC
  Administered 2013-04-24: 20 mg via INTRAVENOUS
  Filled 2013-04-24: qty 50

## 2013-04-24 MED ORDER — SODIUM CHLORIDE 0.9 % IV BOLUS (SEPSIS)
1000.0000 mL | Freq: Once | INTRAVENOUS | Status: AC
  Administered 2013-04-24: 1000 mL via INTRAVENOUS

## 2013-04-24 MED ORDER — ONDANSETRON HCL 4 MG PO TABS: 4.0000 mg | ORAL_TABLET | Freq: Four times a day (QID) | ORAL | Status: DC

## 2013-04-24 MED ORDER — GI COCKTAIL ~~LOC~~
30.0000 mL | Freq: Once | ORAL | Status: AC
  Administered 2013-04-24: 30 mL via ORAL
  Filled 2013-04-24: qty 30

## 2013-04-24 MED ORDER — IOHEXOL 300 MG/ML  SOLN
100.0000 mL | Freq: Once | INTRAMUSCULAR | Status: AC | PRN
  Administered 2013-04-24: 100 mL via INTRAVENOUS

## 2013-04-24 MED ORDER — IOHEXOL 300 MG/ML  SOLN
50.0000 mL | Freq: Once | INTRAMUSCULAR | Status: AC | PRN
  Administered 2013-04-24: 50 mL via ORAL

## 2013-04-24 MED ORDER — ONDANSETRON HCL 4 MG/2ML IJ SOLN
4.0000 mg | Freq: Once | INTRAMUSCULAR | Status: AC
  Administered 2013-04-24: 4 mg via INTRAVENOUS
  Filled 2013-04-24: qty 2

## 2013-04-24 MED ORDER — PANTOPRAZOLE SODIUM 40 MG IV SOLR
40.0000 mg | Freq: Once | INTRAVENOUS | Status: AC
  Administered 2013-04-24: 40 mg via INTRAVENOUS
  Filled 2013-04-24: qty 40

## 2013-04-24 NOTE — ED Notes (Addendum)
Pt reports central abdominal pain 9/10 since Wednesday, nausea present, denies vomiting. Pt went to urgent care and was sent to ED. Last bowel movement this morning. Denies dysuria.  Hx of colon polyps, 2 colonoscopies.

## 2013-04-24 NOTE — ED Provider Notes (Signed)
CSN: [id number]     Arrival date & time 04/24/13  1049 History   First MD Initiated Contact with Patient 04/24/13 1050     Chief Complaint  Patient presents with  . Abdominal Pain   (Consider location/radiation/quality/duration/timing/severity/associated sxs/prior Treatment) HPI  57 year old male with abdominal pain. Gradual onset on Wednesday. Pain is diffuse, possibly worse from the umbilicus. Does not lateralize. Is fairly constant. Does not radiate. Patient has a hard time describing it. When offered descriptors such as "sharp, dull, aching, pressure, burning, bloating, etc" he replied, "a little bit of all the above." No appreciable exacerbating or relieving factors. Nausea, but no vomiting. No fever or chills. No urinary complaints. No diarrhea. No BRBPR or melena. No hx of similar pain. No hx of abdominal surgery. Reports screening colonoscopy a couple year ago which he thinks was unremarkable aside from a couple polyps. Has a glass of wine most nights and occasionally more in social situations  Past Medical History  Diagnosis Date  . Hx of colonic polyps   . [id number](272.5)    Past Surgical History  Procedure Laterality Date  . Colonoscopy  2004    benign polyps per Dr. Corinda Gubler  . Colonoscopy  02/14/10    per Dr. Juanda Chance, polyps, repeat in 5 yrs   Family History  Problem Relation Age of Onset  . Arthritis      family hx  . Cancer      colon/family hx  . Kidney disease      family hx  . Cancer Father     non-Hodgkins lymphoma  . Prostate cancer Brother    History  Substance Use Topics  . Smoking status: Never Smoker   . Smokeless tobacco: Never Used  . Alcohol Use: 2.5 oz/week    5 drink(s) per week    Review of Systems  All systems reviewed and negative, other than as noted in HPI.   Allergies  Review of patient's allergies indicates no known allergies.  Home Medications   Current Outpatient Rx  Name  Route  Sig  Dispense  Refill  . ibuprofen  (ADVIL,MOTRIN) 200 MG tablet   Oral   Take 400 mg by mouth every 6 (six) hours as needed for mild pain or moderate pain (pain).         . naproxen sodium (ANAPROX) 220 MG tablet   Oral   Take 220 mg by mouth daily as needed (pain).         . EXPIRED: promethazine (PHENERGAN) 25 MG tablet   Oral   Take 1 tablet (25 mg total) by mouth every 6 (six) hours as needed for nausea.   20 tablet   0   . sildenafil (VIAGRA) 100 MG tablet   Oral   Take 1 tablet (100 mg total) by mouth as needed.   10 tablet   11    BP 130/76  Pulse 48  Temp(Src) 98.4 F (36.9 C) (Oral)  Resp 16  SpO2 98% Physical Exam  Nursing note and vitals reviewed. Constitutional: He appears well-developed and well-nourished. No distress.  HENT:  Head: Normocephalic and atraumatic.  Eyes: Conjunctivae and EOM are normal. Pupils are equal, round, and reactive to light. Right eye exhibits no discharge. Left eye exhibits no discharge.  Neck: Neck supple.  Cardiovascular: Normal rate, regular rhythm and normal heart sounds.  Exam reveals no gallop and no friction rub.   No murmur heard. Pulmonary/Chest: Effort normal and breath sounds normal. No respiratory distress.  Abdominal: Soft.  He exhibits no distension and no mass. There is no tenderness. There is no rebound and no guarding.  Pt seems pretty uncomfortable and shifting position frequently in bed. Abdominal exam is benign though. Normal appearance. No distension. No tenderness. No mass.  Genitourinary:  No cva tenderness  Musculoskeletal: He exhibits no edema and no tenderness.  Neurological: He is alert.  Skin: Skin is warm and dry. He is not diaphoretic.  Psychiatric: He has a normal mood and affect. His behavior is normal. Thought content normal.    ED Course  Procedures (including critical care time) Labs Review Labs Reviewed  COMPREHENSIVE METABOLIC PANEL - Abnormal; Notable for the following:    Glucose, Bld 109 (*)    All other components  within normal limits  CBC WITH DIFFERENTIAL  URINALYSIS, ROUTINE W REFLEX MICROSCOPIC  LIPASE, BLOOD   Imaging Review Ct Abdomen Pelvis W Contrast  04/24/2013   CLINICAL DATA:  Mid abdominal pain, nausea, leukocytosis  EXAM: CT ABDOMEN AND PELVIS WITH CONTRAST  TECHNIQUE: Multidetector CT imaging of the abdomen and pelvis was performed using the standard protocol following bolus administration of intravenous contrast.  CONTRAST:  50mL OMNIPAQUE IOHEXOL 300 MG/ML SOLN, OMNIPAQUE IOHEXOL 300 MG/ML SOLN  COMPARISON:  None.  FINDINGS: Minimal dependent atelectasis at the lung bases.  15 mm cyst in the anterior segment right hepatic lobe (series 2/image 15).  Spleen, pancreas, and adrenal glands are within normal limits.  Gallbladder is unremarkable. No intrahepatic or extrahepatic ductal dilatation.  Two nonobstructing right renal calculi measuring 2 mm (coronal images 30 and 48). Left kidney is within normal limits. No hydronephrosis.  No evidence of bowel obstruction.  Normal appendix.  No evidence of abdominal aortic aneurysm.  No abdominopelvic ascites.  No suspicious abdominopelvic lymphadenopathy.  Mild prostatomegaly.  Bladder is within normal limits.  Calcified pelvic phleboliths.  Mild degenerative changes of the visualized thoracolumbar spine. Large sacral Tarlov cysts.  IMPRESSION: No evidence of bowel obstruction.  Normal appendix.  Two nonobstructing right renal calculi measuring 2 mm. No hydronephrosis.   Electronically Signed   By: Charline Bills M.D.   On: 04/24/2013 13:42    EKG Interpretation   None       MDM   1. Abdominal pain     2:24 PM W/u unremarkable. Pt appears much more comfortable and reports pain much improved. Unclear etiology. Consider possible mesenteric ischemia with what seemed to be pretty significant abdominal pain w/ a pretty benign exam. No post-prandial change. No hx of afib, hypercoagulable state, or other factors apparent to me which would place  him at increased risk for embolism, thrombosis or low flow state. No CT findings suggestive of this or for other explanatory pathology. Will check a lactic acid. Unless this is significanty elevated, then likely DC now that symptoms almost completely resolved and w/u reassuring.   3:32 PM Pt remains with minimal symptoms. Lactic acid normal. W/u has been reassuring. I feel he is safe for discharge at this time. Pt and mother some what disappointed in lack of specific diagnosis. Difficult to give exact explanation with pain that is poorly localized, little in terms of associated symptoms or exacerbating/relieving factors, not reproducible on exam and no suggestive lab or radiographic findings.Reassurance provided that w/u has been reassuring. I feel he is appropriate for outpt FU at this time. He was established GI care with Dr Juanda Chance. Encouraged to make an appointment if symptoms persist. Emergent return precautions discussed as well.   Raeford Razor, MD  04/27/13 0921 

## 2013-04-24 NOTE — ED Notes (Signed)
Delay in discharge due to nausea EDP Kohut made aware

## 2013-04-25 ENCOUNTER — Encounter: Payer: Self-pay | Admitting: Physician Assistant

## 2013-04-25 ENCOUNTER — Ambulatory Visit: Payer: BC Managed Care – PPO | Admitting: Family Medicine

## 2013-04-25 ENCOUNTER — Ambulatory Visit (INDEPENDENT_AMBULATORY_CARE_PROVIDER_SITE_OTHER): Payer: BC Managed Care – PPO | Admitting: Physician Assistant

## 2013-04-25 ENCOUNTER — Telehealth: Payer: Self-pay | Admitting: Internal Medicine

## 2013-04-25 ENCOUNTER — Telehealth (HOSPITAL_BASED_OUTPATIENT_CLINIC_OR_DEPARTMENT_OTHER): Payer: Self-pay

## 2013-04-25 VITALS — BP 120/70 | HR 74 | Ht 71.5 in | Wt 184.6 lb

## 2013-04-25 DIAGNOSIS — R1013 Epigastric pain: Secondary | ICD-10-CM

## 2013-04-25 NOTE — Patient Instructions (Addendum)
We scheduled the Ultrasound for tomorrow 04-26-2013 at 8:30 am . Arrive at 8:15 am. Location is [facility] Radiology, 1st floor.  Go to registration .  You have been scheduled for an endoscopy with propofol. Please follow written instructions given to you at your visit today.  We have given you samples of Zegerid 40 mg. Take 1 tab daily in the Am.  Let us know if this helps. If so, we can do a prescription.  We have given you a savings card if you do get a prescription. If you use inhalers (even only as needed), please bring them with you on the day of your procedure. Your physician has requested that you go to www.startemmi.com and enter the access code given to you at your visit today. This web site gives a general overview about your procedure. However, you should still follow specific instructions given to you by our office regarding your preparation for the procedure.

## 2013-04-25 NOTE — Telephone Encounter (Signed)
Patient was in ED yesterday for abdominal pain. He was told to follow up with GI. States he is no better today and would like to be seen. Scheduled with Mike Gip, PA today at 3:00 PM.

## 2013-04-26 ENCOUNTER — Ambulatory Visit (HOSPITAL_COMMUNITY)
Admission: RE | Admit: 2013-04-26 | Discharge: 2013-04-26 | Disposition: A | Discharge status: Home / Self Care | Payer: BC Managed Care – PPO | Source: Ambulatory Visit | Attending: Physician Assistant | Admitting: Physician Assistant

## 2013-04-26 ENCOUNTER — Encounter: Payer: Self-pay | Admitting: Physician Assistant

## 2013-04-26 DIAGNOSIS — K8020 Calculus of gallbladder without cholecystitis without obstruction: Secondary | ICD-10-CM | POA: Insufficient documentation

## 2013-04-26 DIAGNOSIS — K824 Cholesterolosis of gallbladder: Secondary | ICD-10-CM | POA: Insufficient documentation

## 2013-04-26 DIAGNOSIS — R1013 Epigastric pain: Secondary | ICD-10-CM | POA: Insufficient documentation

## 2013-04-26 NOTE — Progress Notes (Signed)
Reviewed and agree with PPI, EGD, ,repeat amy/lipase/LFT's if has a recurrent attack within 24 hours of the attack.

## 2013-04-26 NOTE — Progress Notes (Signed)
Subjective:    Patient ID: Wesley [name], male    DOB: February 02, 1956, 57 y.o.   MRN: [id number]  HPI  Wesley [name] is a pleasant 57 year old white male known to Dr. Juanda Chance from prior colonoscopy. He has family history of colon cancer in his father and last had colonoscopy in September of 2011 and was found to have mild diverticulosis in the left colon and 2 polyps which were removed and found to be hyperplastic. He is generally in good health. Presents today after an emergency room visit over the weekend   With acute abdominal pain. Patient states that he had onset of mid to upper abdominal pain on Wednesday 1126 2014. He says this became gradually progressive and fairly constant. He had pain to the point of doubling him over on Sunday when he went to the emergency room. He denies any radiation to his back or his chest. He describes it as a dull achy-type pain he has had some associated nausea but no vomiting. He's had no fever or chills and no changes in his bowel habits. No melena or hematochezia. He denies any urinary symptoms. Though he has naproxen and Advil listed on his meds list he has not been taking any regular anti-inflammatories. He has not noticed any change with by mouth intake and no positional aggravating factors Patient does mention that he's been under a lot of personal stress recently. Evaluation was done in the emergency room on 04/24/2013 and he had labs with CBC ,CMET  and lipase all of which were unremarkable. He also had CT scan of the abdomen and pelvis done with contrast which showed 2 nonobstructing right renal calculi measuring 2 mm EHN no hydronephrosis otherwise negative CT scan. He says he feels better today than he did yesterday but was still uncomfortable early this morning.    Review of Systems  Constitutional: Negative.   HENT: Negative.   Eyes: Negative.   Respiratory: Negative.   Cardiovascular: Negative.   Gastrointestinal: Positive for nausea and abdominal pain.   Endocrine: Negative.   Genitourinary: Negative.   Musculoskeletal: Negative.   Skin: Negative.   Allergic/Immunologic: Negative.   Neurological: Negative.   Hematological: Negative.   Psychiatric/Behavioral: Negative.    Outpatient Prescriptions Prior to Visit  Medication Sig Dispense Refill  . ibuprofen (ADVIL,MOTRIN) 200 MG tablet Take 400 mg by mouth every 6 (six) hours as needed for mild pain or moderate pain (pain).      . naproxen sodium (ANAPROX) 220 MG tablet Take 220 mg by mouth daily as needed (pain).      . ondansetron (ZOFRAN) 4 MG tablet Take 1 tablet (4 mg total) by mouth every 6 (six) hours.  12 tablet  0  . oxyCODONE-acetaminophen (PERCOCET/ROXICET) 5-325 MG per tablet Take 1-2 tablets by mouth every 4 (four) hours as needed for severe pain.  15 tablet  0  . sildenafil (VIAGRA) 100 MG tablet Take 1 tablet (100 mg total) by mouth as needed.  10 tablet  11  . promethazine (PHENERGAN) 25 MG tablet Take 1 tablet (25 mg total) by mouth every 6 (six) hours as needed for nausea.  20 tablet  0   No facility-administered medications prior to visit.   No Known Allergies Patient Active Problem List   Diagnosis Date Noted  . Vertigo 06/15/2012  . ACUTE SINUSITIS, UNSPECIFIED 08/06/2009  . 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 HEADACHE 03/10/2007  . COLONIC POLYPS, HX OF 03/10/2007   History  Substance Use Topics  .  Smoking status: Never Smoker   . Smokeless tobacco: Never Used  . Alcohol Use: 2.5 oz/week    5 drink(s) per week     Comment: social   family history includes Arthritis in his father and another family member; Cancer in his father; Colon cancer in his father; Kidney disease in his brother; Prostate cancer in his brother; Rheumatic fever in his maternal grandfather.     Objective:   Physical Exam  white male in no acute distress, pleasant blood pressure 120/70 pulse 74 height 5 foot 11 weight 184. HEENT ;nontraumatic normocephalic EOMI PERRLA sclera  anicteric, Supple; no JVD, Cardiovascular ;regular rate and rhythm with S1-S2 no murmur or gallop, Pulmonary; clear bilaterally, Abdomen; soft he is very minimal tenderness in the hypogastric area no guarding or rebound no palpable mass or hepatosplenomegaly bowel sounds are present, Rectal; exam not done, Extremities; no clubbing cyanosis or edema skin warm and dry, Psych ;mood and affect normal and appropriate        Assessment & Plan:  #32  57 year old male with 6 day history of mid to upper abdominal pain of unclear etiology with unremarkable labs and CT scan. Will rule out peptic ulcer disease and/or gastroduodenitis. No evidence for gallstones by CT though may still need to consider. #2 personal history of hyperplastic polyps #3 family history of colon cancer patient's father-last colonoscopy September 2011 will be due for followup 2016.  Plan; patient has a prescription for Percocet from the emergency room which he has not filled, discussed and he will use as needed Zofran 4 mg every 6 hours when necessary for nausea Start Zegerid 40 mg by mouth every morning-samples given today for short-term use Schedule for upper endoscopy with Dr. Chauncy Passy discussed in detail and he is agreeable to proceed Schedule for upper abdominal ultrasound. Further plans pending results of above

## 2013-04-27 ENCOUNTER — Encounter: Payer: Self-pay | Admitting: Family Medicine

## 2013-04-27 ENCOUNTER — Ambulatory Visit (INDEPENDENT_AMBULATORY_CARE_PROVIDER_SITE_OTHER): Payer: BC Managed Care – PPO | Admitting: Family Medicine

## 2013-04-27 VITALS — BP 110/70 | HR 68 | Temp 98.2°F | Wt 186.0 lb

## 2013-04-27 DIAGNOSIS — R109 Unspecified abdominal pain: Secondary | ICD-10-CM

## 2013-04-27 NOTE — Progress Notes (Signed)
Pre visit review using our clinic review tool, if applicable. No additional management support is needed unless otherwise documented below in the visit note. 

## 2013-04-27 NOTE — Progress Notes (Signed)
   Subjective:    Patient ID: Wesley [name], male    DOB: 1956-04-06, 57 y.o.   MRN: [id number]  HPI Here to discuss recent abdominal pains. He went to the ER on 04-24-13 for dull central abdominal pains with no other sx, no fever or nausea or change in bowel habits. His labs were normal, including a CBC. He had an abdominal CT which was unremarkable. He then saw the GI PA who ordered an [place], which showed only tiny gallstones but no etiology of his pain. He is scheduled for upper endoscopy tomorrow am with Dr. Juanda Chance. He is taking Percocet and this controls his pain fairly well.    Review of Systems  Constitutional: Negative.   Respiratory: Negative.   Cardiovascular: Negative.   Gastrointestinal: Positive for abdominal pain. Negative for nausea, vomiting, diarrhea, constipation, blood in stool, abdominal distention, anal bleeding and rectal pain.  Genitourinary: Negative.        Objective:   Physical Exam  Constitutional: He appears well-developed and well-nourished. No distress.  Abdominal: Soft. Bowel sounds are normal. He exhibits no distension and no mass. There is no tenderness. There is no rebound and no guarding.          Assessment & Plan:  This is likely duodenal pain, either duodenitis or ulcers. He will follow up per GI.

## 2013-04-28 ENCOUNTER — Ambulatory Visit (AMBULATORY_SURGERY_CENTER): Payer: BC Managed Care – PPO | Admitting: Internal Medicine

## 2013-04-28 ENCOUNTER — Encounter: Payer: Self-pay | Admitting: Internal Medicine

## 2013-04-28 VITALS — BP 118/76 | HR 60 | Temp 96.1°F | Resp 18 | Ht 71.5 in | Wt 184.0 lb

## 2013-04-28 DIAGNOSIS — K2960 Other gastritis without bleeding: Secondary | ICD-10-CM

## 2013-04-28 DIAGNOSIS — K2270 Barrett's esophagus without dysplasia: Secondary | ICD-10-CM

## 2013-04-28 DIAGNOSIS — R1013 Epigastric pain: Secondary | ICD-10-CM

## 2013-04-28 DIAGNOSIS — K2970 Gastritis, unspecified, without bleeding: Secondary | ICD-10-CM

## 2013-04-28 MED ORDER — SODIUM CHLORIDE 0.9 % IV SOLN: 500.0000 mL | INTRAVENOUS | Status: DC

## 2013-04-28 MED ORDER — DICYCLOMINE HCL 10 MG PO CAPS: 10.0000 mg | ORAL_CAPSULE | Freq: Two times a day (BID) | ORAL | Status: DC | PRN

## 2013-04-28 NOTE — Progress Notes (Signed)
Lidocaine-40mg IV prior to Propofol InductionPropofol given over incremental dosages 

## 2013-04-28 NOTE — Progress Notes (Signed)
Patient did not experience any of the following events: a burn prior to discharge; a fall within the facility; wrong site/side/patient/procedure/implant event; or a hospital transfer or hospital admission upon discharge from the facility. (G8907) Patient did not have preoperative order for IV antibiotic SSI prophylaxis. (G8918)  

## 2013-04-28 NOTE — Op Note (Signed)
 [facility] [address] [place], [address]   ENDOSCOPY PROCEDURE REPORT  PATIENT: Wesley, [name]  MR#: [id number] BIRTHDATE: January 08, 1956 , 57  yrs. old GENDER: Male ENDOSCOPIST: Hart Carwin, MD REFERRED BY:  Nelwyn Salisbury, M.D. PROCEDURE DATE:  04/28/2013 PROCEDURE:  EGD w/ biopsy ASA CLASS:     Class II INDICATIONS:  abdominal pain I evaluated in the emergency room. Negative CT scan.  Gallstones on ultrasound.  Normal liver function tests.[name] MEDICATIONS: MAC sedation, administered by CRNA and propofol (Diprivan) 200mg  IV TOPICAL ANESTHETIC: Cetacaine Spray  DESCRIPTION OF PROCEDURE: After the risks benefits and alternatives of the procedure were thoroughly explained, informed consent was obtained.  The    endoscope was introduced through the mouth and advanced to the second portion of the duodenum. Without limitations.  The instrument was slowly withdrawn as the mucosa was fully examined.      Esophagus: talk some mid and distal esophageal mucosa was normal. There was mild erythema at the GE junction the Z line was very discrete although it appeared normal. There was no stricture or erosions. Distal to the Z line was a 2 cm nonreducible hiatal hernia  Stomach:the stomach was insufflated with air and showed normal rugal folds. There were several pinpoint erosions in the gastric antrum. Pyloric outlet was normal. Retroflexion of the endoscope revealed normal fundus and cardia Duodenum:  duodenal bulb and descending duodenum was unremarkable Biopsies were taken from the gastric antrum to rule out H. pylori. Biopsies were also taken from the GE junction to rule out Barrett's esophagus:[          The scope was then withdrawn from the patient and the procedure completed.  COMPLICATIONS: There were no complications. ENDOSCOPIC IMPRESSION:  minimal antral gastritis. Status post biopsies, 2 cm nonreducible hiatal hernia Status post biopsies GE  junction RECOMMENDATIONS:  1.  Await pathology results 2.  continue Zegrid Add Bentyl 10 mg twice a day If symptoms continue consider HIDA scan  REPEAT EXAM: for EGD pending biopsy results.  eSigned:  Hart Carwin, MD 04/28/2013 10:20 AM   CC:  PATIENT NAME:  Wesley, [name] MR#: [id number]

## 2013-04-28 NOTE — Progress Notes (Signed)
No egg or soy allergy. ewm No problems with past sedation. ewm 

## 2013-04-28 NOTE — Progress Notes (Signed)
Called to room to assist during endoscopic procedure.  Patient ID and intended procedure confirmed with present staff. Received instructions for my participation in the procedure from the performing physician.  

## 2013-04-28 NOTE — Patient Instructions (Signed)
YOU HAD AN ENDOSCOPIC PROCEDURE TODAY AT THE [facility]: Refer to the procedure report that was given to you for any specific questions about what was found during the examination.  If the procedure report does not answer your questions, please call your gastroenterologist to clarify.  If you requested that your care partner not be given the details of your procedure findings, then the procedure report has been included in a sealed envelope for you to review at your convenience later.  YOU SHOULD EXPECT: Some feelings of bloating in the abdomen. Passage of more gas than usual.  Walking can help get rid of the air that was put into your GI tract during the procedure and reduce the bloating. If you had a lower endoscopy (such as a colonoscopy or flexible sigmoidoscopy) you may notice spotting of blood in your stool or on the toilet paper. If you underwent a bowel prep for your procedure, then you may not have a normal bowel movement for a few days.  DIET: Your first meal following the procedure should be a light meal and then it is ok to progress to your normal diet.  A half-sandwich or bowl of soup is an example of a good first meal.  Heavy or fried foods are harder to digest and may make you feel nauseous or bloated.  Likewise meals heavy in dairy and vegetables can cause extra gas to form and this can also increase the bloating.  Drink plenty of fluids but you should avoid alcoholic beverages for 24 hours.  ACTIVITY: Your care partner should take you home directly after the procedure.  You should plan to take it easy, moving slowly for the rest of the day.  You can resume normal activity the day after the procedure however you should NOT DRIVE or use heavy machinery for 24 hours (because of the sedation medicines used during the test).    SYMPTOMS TO REPORT IMMEDIATELY: A gastroenterologist can be reached at any hour.  During normal business hours, 8:30 AM to 5:00 PM Monday through Friday,  call [phone].  After hours and on weekends, please call the GI answering service at [phone] who will take a message and have the physician on call contact you.    Following upper endoscopy (EGD)  Vomiting of blood or coffee ground material  New chest pain or pain under the shoulder blades  Painful or persistently difficult swallowing  New shortness of breath  Fever of 100F or higher  Black, tarry-looking stools  FOLLOW UP: If any biopsies were taken you will be contacted by phone or by letter within the next 1-3 weeks.  Call your gastroenterologist if you have not heard about the biopsies in 3 weeks.  Our staff will call the home number listed on your records the next business day following your procedure to check on you and address any questions or concerns that you may have at that time regarding the information given to you following your procedure. This is a courtesy call and so if there is no answer at the home number and we have not heard from you through the emergency physician on call, we will assume that you have returned to your regular daily activities without incident.  SIGNATURES/CONFIDENTIALITY: You and/or your care partner have signed paperwork which will be entered into your electronic medical record.  These signatures attest to the fact that that the information above on your After Visit Summary has been reviewed and is understood.  Full   responsibility of the confidentiality of this discharge information lies with you and/or your care-partner.  Wait biopsy results   Hiatal hernia, gastritis-handouts given  Continue Zegrid  Add bentyl 10 mg twice a day.  If symptoms continue consider a HIDA scan.

## 2013-04-29 ENCOUNTER — Telehealth: Payer: Self-pay

## 2013-04-29 NOTE — Telephone Encounter (Signed)
Left a message on the pt's answering machine @ [phone] to call if he has any questions or concerns. Maw

## 2013-05-03 ENCOUNTER — Encounter: Payer: Self-pay | Admitting: Internal Medicine

## 2013-06-03 ENCOUNTER — Telehealth: Payer: Self-pay | Admitting: Internal Medicine

## 2013-06-03 NOTE — Telephone Encounter (Signed)
Left a message for patient to call me. 

## 2013-06-03 NOTE — Telephone Encounter (Signed)
Spoke with patient and he is calling about his results from EGD. Discussed Barrett's esophagus. He states he is not on Zegerid at this time. Per procedure note he was to continue this. Does he need to be on it? Please, advise.

## 2013-06-04 NOTE — Telephone Encounter (Signed)
Yes, he needs to be on a PPI

## 2013-06-06 MED ORDER — OMEPRAZOLE-SODIUM BICARBONATE 40-1100 MG PO CAPS: 1.0000 | ORAL_CAPSULE | Freq: Every day | ORAL | Status: DC

## 2013-06-06 NOTE — Telephone Encounter (Signed)
Spoke with patient and gave him Dr. Nichola Sizer recommendation. Rx sent to pharmacy.

## 2013-06-06 NOTE — Telephone Encounter (Signed)
Left a message to call back.

## 2013-06-13 ENCOUNTER — Other Ambulatory Visit (INDEPENDENT_AMBULATORY_CARE_PROVIDER_SITE_OTHER): Payer: BC Managed Care – PPO

## 2013-06-13 DIAGNOSIS — Z0000 Encounter for general adult medical examination without abnormal findings: Secondary | ICD-10-CM

## 2013-06-13 LAB — CBC WITH DIFFERENTIAL/PLATELET
Basophils Absolute: 0 10*3/uL (ref 0.0–0.1)
Basophils Relative: 0.6 % (ref 0.0–3.0)
EOS ABS: 0.6 10*3/uL (ref 0.0–0.7)
Eosinophils Relative: 9.5 % — ABNORMAL HIGH (ref 0.0–5.0)
HEMATOCRIT: 42.8 % (ref 39.0–52.0)
HEMOGLOBIN: 14.4 g/dL (ref 13.0–17.0)
LYMPHS ABS: 2 10*3/uL (ref 0.7–4.0)
Lymphocytes Relative: 29.8 % (ref 12.0–46.0)
MCHC: 33.7 g/dL (ref 30.0–36.0)
MCV: 89.3 fl (ref 78.0–100.0)
Monocytes Absolute: 0.6 10*3/uL (ref 0.1–1.0)
Monocytes Relative: 8.9 % (ref 3.0–12.0)
NEUTROS ABS: 3.4 10*3/uL (ref 1.4–7.7)
Neutrophils Relative %: 51.2 % (ref 43.0–77.0)
PLATELETS: 263 10*3/uL (ref 150.0–400.0)
RBC: 4.8 Mil/uL (ref 4.22–5.81)
RDW: 13.4 % (ref 11.5–14.6)
WBC: 6.6 10*3/uL (ref 4.5–10.5)

## 2013-06-13 LAB — POCT URINALYSIS DIPSTICK
BILIRUBIN UA: NEGATIVE
Blood, UA: NEGATIVE
GLUCOSE UA: NEGATIVE
Ketones, UA: NEGATIVE
LEUKOCYTES UA: NEGATIVE
Nitrite, UA: NEGATIVE
Protein, UA: NEGATIVE
SPEC GRAV UA: 1.02
Urobilinogen, UA: 0.2
pH, UA: 5.5

## 2013-06-13 LAB — BASIC METABOLIC PANEL
BUN: 12 mg/dL (ref 6–23)
CALCIUM: 8.9 mg/dL (ref 8.4–10.5)
CO2: 28 mEq/L (ref 19–32)
Chloride: 105 mEq/L (ref 96–112)
Creatinine, Ser: 0.9 mg/dL (ref 0.4–1.5)
GFR: 88.41 mL/min (ref >60.00)
Glucose, Bld: 67 mg/dL — ABNORMAL LOW (ref 70–99)
POTASSIUM: 4 meq/L (ref 3.5–5.1)
Sodium: 139 mEq/L (ref 135–145)

## 2013-06-13 LAB — LIPID PANEL
CHOLESTEROL: 148 mg/dL (ref 0–200)
HDL: 60.1 mg/dL (ref >39.00)
LDL Cholesterol: 71 mg/dL (ref 0–99)
Total CHOL/HDL Ratio: 2
Triglycerides: 83 mg/dL (ref 0.0–149.0)
VLDL: 16.6 mg/dL (ref 0.0–40.0)

## 2013-06-13 LAB — HEPATIC FUNCTION PANEL
ALK PHOS: 40 U/L (ref 39–117)
ALT: 24 U/L (ref 0–53)
AST: 25 U/L (ref 0–37)
Albumin: 3.8 g/dL (ref 3.5–5.2)
BILIRUBIN DIRECT: 0.1 mg/dL (ref 0.0–0.3)
BILIRUBIN TOTAL: 0.8 mg/dL (ref 0.3–1.2)
TOTAL PROTEIN: 5.9 g/dL — AB (ref 6.0–8.3)

## 2013-06-13 LAB — TSH: TSH: 1.75 u[IU]/mL (ref 0.35–5.50)

## 2013-06-13 LAB — PSA: PSA: 1.66 ng/mL (ref 0.10–4.00)

## 2013-06-20 ENCOUNTER — Encounter: Payer: BC Managed Care – PPO | Admitting: Family Medicine

## 2013-07-01 ENCOUNTER — Encounter: Payer: BC Managed Care – PPO | Admitting: Family Medicine

## 2013-07-12 ENCOUNTER — Encounter: Payer: BC Managed Care – PPO | Admitting: Family Medicine

## 2013-07-20 ENCOUNTER — Encounter: Payer: Self-pay | Admitting: Family Medicine

## 2013-07-20 ENCOUNTER — Ambulatory Visit (INDEPENDENT_AMBULATORY_CARE_PROVIDER_SITE_OTHER): Payer: BC Managed Care – PPO | Admitting: Family Medicine

## 2013-07-20 VITALS — BP 108/66 | HR 71 | Temp 97.9°F | Ht 71.25 in | Wt 180.0 lb

## 2013-07-20 DIAGNOSIS — Z0000 Encounter for general adult medical examination without abnormal findings: Secondary | ICD-10-CM

## 2013-07-20 DIAGNOSIS — K219 Gastro-esophageal reflux disease without esophagitis: Secondary | ICD-10-CM

## 2013-07-20 NOTE — Progress Notes (Signed)
Pre visit review using our clinic review tool, if applicable. No additional management support is needed unless otherwise documented below in the visit note. 

## 2013-07-20 NOTE — Progress Notes (Signed)
   Subjective:    Patient ID: REXFORD PREVO, male    DOB: 09/30/1955, 58 y.o.   MRN: [id number]  HPI 58 yr old male for a cpx. He feels well.    Review of Systems  Constitutional: Negative.   HENT: Negative.   Eyes: Negative.   Respiratory: Negative.   Cardiovascular: Negative.   Gastrointestinal: Negative.   Genitourinary: Negative.   Musculoskeletal: Negative.   Skin: Negative.   Neurological: Negative.   Psychiatric/Behavioral: Negative.        Objective:   Physical Exam  Constitutional: He is oriented to person, place, and time. He appears well-developed and well-nourished. No distress.  HENT:  Head: Normocephalic and atraumatic.  Right Ear: External ear normal.  Left Ear: External ear normal.  Nose: Nose normal.  Mouth/Throat: Oropharynx is clear and moist. No oropharyngeal exudate.  Eyes: Conjunctivae and EOM are normal. Pupils are equal, round, and reactive to light. Right eye exhibits no discharge. Left eye exhibits no discharge. No scleral icterus.  Neck: Neck supple. No JVD present. No tracheal deviation present. No thyromegaly present.  Cardiovascular: Normal rate, regular rhythm, normal heart sounds and intact distal pulses.  Exam reveals no gallop and no friction rub.   No murmur heard. Pulmonary/Chest: Effort normal and breath sounds normal. No respiratory distress. He has no wheezes. He has no rales. He exhibits no tenderness.  Abdominal: Soft. Bowel sounds are normal. He exhibits no distension and no mass. There is no tenderness. There is no rebound and no guarding.  Genitourinary: Rectum normal, prostate normal and penis normal. Guaiac negative stool. No penile tenderness.  Musculoskeletal: Normal range of motion. He exhibits no edema and no tenderness.  Lymphadenopathy:    He has no cervical adenopathy.  Neurological: He is alert and oriented to person, place, and time. He has normal reflexes. No cranial nerve deficit. He exhibits normal muscle tone.  Coordination normal.  Skin: Skin is warm and dry. No rash noted. He is not diaphoretic. No erythema. No pallor.  Psychiatric: He has a normal mood and affect. His behavior is normal. Judgment and thought content normal.          Assessment & Plan:  Well exam.

## 2013-08-12 ENCOUNTER — Ambulatory Visit (INDEPENDENT_AMBULATORY_CARE_PROVIDER_SITE_OTHER): Payer: BC Managed Care – PPO | Admitting: Family Medicine

## 2013-08-12 ENCOUNTER — Encounter: Payer: Self-pay | Admitting: Family Medicine

## 2013-08-12 ENCOUNTER — Ambulatory Visit: Payer: Self-pay | Admitting: Internal Medicine

## 2013-08-12 VITALS — BP 120/80 | Temp 99.6°F | Wt 187.0 lb

## 2013-08-12 DIAGNOSIS — J209 Acute bronchitis, unspecified: Secondary | ICD-10-CM

## 2013-08-12 MED ORDER — AMOXICILLIN-POT CLAVULANATE 875-125 MG PO TABS: 1.0000 | ORAL_TABLET | Freq: Two times a day (BID) | ORAL | Status: DC

## 2013-08-12 NOTE — Progress Notes (Signed)
   Subjective:    Patient ID: Wesley [name], male    DOB: [birth year], 58 y.o.   MRN: [id number]  HPI Here for 4 days of body aches, SOB, coughing up dark sputum, and fever. No NVD.    Review of Systems  Constitutional: Positive for fever and chills.  HENT: Positive for congestion.   Eyes: Negative.   Respiratory: Positive for cough and chest tightness. Negative for wheezing.   Cardiovascular: Negative.        Objective:   Physical Exam  Constitutional: He appears well-developed and well-nourished. No distress.  HENT:  Right Ear: External ear normal.  Left Ear: External ear normal.  Nose: Nose normal.  Mouth/Throat: Oropharynx is clear and moist.  Eyes: Conjunctivae are normal.  Pulmonary/Chest: Effort normal. No respiratory distress. He has no wheezes. He has no rales.  Scattered rhonchi  Lymphadenopathy:    He has no cervical adenopathy.          Assessment & Plan:  Add Mucinex and fluids.

## 2013-08-12 NOTE — Progress Notes (Signed)
Pre visit review using our clinic review tool, if applicable. No additional management support is needed unless otherwise documented below in the visit note. 

## 2013-08-15 ENCOUNTER — Telehealth: Payer: Self-pay | Admitting: Family Medicine

## 2013-08-15 NOTE — Telephone Encounter (Signed)
[facility] Triage Call Report Triage Record Num: [id number] Operator: Thereasa Parkin Patient Name: Wesley [name] Call Date & Time: 08/11/2013 5:19:58PM Patient Phone: [phone] PCP: Ishmael Holter. Sarajane Jews Patient Gender: Male PCP Fax : [phone] Patient DOB: [birth year] Practice Name: [facility] Reason for Call: Caller: [name]/Patient; PCP: Alysia Penna [facility]); CB#: [phone]; Call regarding Cough/Congestion; Reports rust colored phlem, shortness of breath with activity, chest tightness, muscular back discomfort, headache, malaise. Onset: 08/09/13. [place]; next trip 08/17/13. Advised to see MD within 24 hours for productive cough with colored sputum per Cough. Requested first morning appointment with any provider. Scheduled for 1030 08/12/13 with Dr Regis Bill. Protocol(s) Used: Cough - Adult Recommended Outcome per Protocol: See Provider within 24 hours Reason for Outcome: Productive cough with colored sputum (other than clear or white sputum) Care Advice: ~ Use a cool mist humidifier to moisten air. Be sure to clean according to manufacturer's instructions. Call provider if fever greater than 101.5 F (38.6 C) or 100.5 F (38.1C) in an immunocompromised patient (such as diabetes, HIV/AIDS, renal disease, chemotherapy, organ transplant, or chronic steroid use) has not improved in 24 hours. ~ Drink more fluids -- water, low-sugar juices, tea and warm soup, especially chicken broth, are options. Avoid caffeinated or alcoholic beverages because they can increase the chance of dehydration. ~ ~ SYMPTOM / CONDITION MANAGEMENT Coughing up mucus or phlegm helps to get rid of an infection. A productive cough should not be stopped. A cough medicine with guaifenesin (Robitussin, Mucinex) can help loosen the mucus. Cough medicine with dextromethorphan (DM) should be avoided. Drinking lots of fluids can help loosen the mucus too, especially warm fluids. ~ 03/

## 2013-09-21 ENCOUNTER — Other Ambulatory Visit: Payer: Self-pay | Admitting: Family Medicine

## 2013-09-21 NOTE — Telephone Encounter (Signed)
Pt calling to request sildenafil (VIAGRA) 100 MG tablet    This refill sent to [facility] before 2 pm today due to pt going out of town today.

## 2014-01-11 ENCOUNTER — Encounter: Payer: Self-pay | Admitting: Family Medicine

## 2014-01-11 ENCOUNTER — Ambulatory Visit (INDEPENDENT_AMBULATORY_CARE_PROVIDER_SITE_OTHER): Payer: 59 | Admitting: Family Medicine

## 2014-01-11 VITALS — BP 108/65 | HR 61 | Temp 98.6°F | Ht 71.25 in | Wt 178.0 lb

## 2014-01-11 DIAGNOSIS — I639 Cerebral infarction, unspecified: Secondary | ICD-10-CM

## 2014-01-11 DIAGNOSIS — R51 Headache: Secondary | ICD-10-CM

## 2014-01-11 DIAGNOSIS — R42 Dizziness and giddiness: Secondary | ICD-10-CM

## 2014-01-11 DIAGNOSIS — I6350 Cerebral infarction due to unspecified occlusion or stenosis of unspecified cerebral artery: Secondary | ICD-10-CM

## 2014-01-11 NOTE — Progress Notes (Signed)
Pre visit review using our clinic review tool, if applicable. No additional management support is needed unless otherwise documented below in the visit note. 

## 2014-01-11 NOTE — Progress Notes (Signed)
   Subjective:    Patient ID: Wesley [name], male    DOB: 1956/04/28, 58 y.o.   MRN: [id number]  HPI Here to follow up on a hospital stay from 01-07-14 to 01-09-14 in [place], [place] for a bout of headaches and severe dizziness. He has a hx of migraines and also of vertigo, which has been diagnosed as benign positional vertigo. When the last episode occurred he was attending a work conference and became very dizzy and nauseated. He as given IV fluids and IV Benadryl and Valium but he did not improve. He then had a full work up including labs, EKG, ECHO, carotid dopplers, a head CT, and an MRI with angiogram. The MRI showed two tiny spots in his cerebellum which appeared to be old infarcts. It was not clear whether these spots were related to his recent symptoms or not. Since then he has felt better but he still has some slight dysequilibrium which comes and goes. He denies any sensation of swirling in circles. He never has blurred vision, slurred speech, numbness in the extremities or any other neurologic deficits. He denies ear pain but he does have slight tinnitus at times. He has been driving but he has been unable to work since [employer] forbid him from [place] a plane until this is resolved. He does not have the records with him from [place] today.    Review of Systems  Constitutional: Negative.   Eyes: Negative.   Respiratory: Negative.   Cardiovascular: Negative.   Neurological: Positive for dizziness, light-headedness and headaches. Negative for tremors, seizures, syncope, facial asymmetry, speech difficulty, weakness and numbness.       Objective:   Physical Exam  Constitutional: He is oriented to person, place, and time. He appears well-developed and well-nourished. No distress.  HENT:  Head: Normocephalic and atraumatic.  Right Ear: External ear normal.  Left Ear: External ear normal.  Eyes: Conjunctivae and EOM are normal. Pupils are equal, round, and reactive to light.  Neck: Neck  supple.  No carotid bruits   Cardiovascular: Normal rate, regular rhythm, normal heart sounds and intact distal pulses.   Pulmonary/Chest: Effort normal and breath sounds normal.  Lymphadenopathy:    He has no cervical adenopathy.  Neurological: He is alert and oriented to person, place, and time. He has normal reflexes. No cranial nerve deficit. He exhibits normal muscle tone. Coordination normal.          Assessment & Plan:  Recent episode of headaches and dizziness. It is not clear if this has resulted from migraines or a vestibular dysfunction or from cerebellar infarcts. I advised him to start taking 81 mg of aspirin daily. We will refer him to Neurology to evaluate. He is arranging for his records from [place] to be sent here.

## 2014-01-20 DIAGNOSIS — Z0279 Encounter for issue of other medical certificate: Secondary | ICD-10-CM

## 2014-01-24 ENCOUNTER — Encounter: Payer: Self-pay | Admitting: Neurology

## 2014-01-24 ENCOUNTER — Ambulatory Visit (INDEPENDENT_AMBULATORY_CARE_PROVIDER_SITE_OTHER): Payer: 59 | Admitting: Neurology

## 2014-01-24 VITALS — Ht 72.0 in | Wt 177.0 lb

## 2014-01-24 DIAGNOSIS — I639 Cerebral infarction, unspecified: Secondary | ICD-10-CM

## 2014-01-24 DIAGNOSIS — I6350 Cerebral infarction due to unspecified occlusion or stenosis of unspecified cerebral artery: Secondary | ICD-10-CM

## 2014-01-24 NOTE — Patient Instructions (Addendum)
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 I will review his prior neurological workup and hospital in [place] and make final decision about further workup. Check MRA of the neck to evaluate for posterior circulation given history of abnormal MRI report showing possible cerebellar infarct. Refer to vestibular rehabilitation for dizziness exercises. Continue aspirin for stroke prevention. Return for followup in 6 weeks or call earlier if necessary

## 2014-01-24 NOTE — Progress Notes (Addendum)
[facility] [address] [place]. [place] [address] [phone]       OFFICE CONSULT NOTE  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 He noted right arm pain which usually precedes his typical migraine headache. He laid down and went to sleep but woke up several hours later with severe vertigo and nausea. He called ambulance and was seen in the ER [place]. And admitted. He had no focal neurological deficits on exam and was thought to have benign positional vertigo. MRI scan of the brain was done which did not reveal any acute abnormality.   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 He was treated with IV meclizine and fluids and Valium and improved briefly into his stay. Carotid Dopplers, CT angiogram of the brain were both normal. MRI scan of the cervical spine was obtained and he complained of some right upper extremity pain and it showed mild degenerative changes at C4-5 to C6-7 without any significant compression. Transthoracic echo was normal. Fasting lipid profile was normal and random glucose was 100 mg percent the patient denies any prior history of strokes or TIAs and he has not had any prior brain imaging studies done. He was seen in the emergency room at [facility] 2 years ago for a similar episode of vertigo. This was diagnosed as  benign positional vertigo. He was advised outpatient referral to vestibular rehabilitation but he did not make that appointment. He denies any ringing in his ears, hearing loss. He does not have significant vascular risk factors in the form of diabetes, hypertension,  hyperlipidemia or smoking. He feels he is completely back to normal.  ROS:   14 system review of systems is positive for dizziness only and all other systems negative  PMH:  Past Medical History  Diagnosis Date  . Hx of colonic polyps   . Headache(784.0)   . Meningitis spinal   . Vertigo   . Stroke     Social History:  History   Social History  . Marital Status: Legally Separated    Spouse Name: N/A    Number of Children: 2  . Years of Education: BA   Occupational History  . airline captain    Social History Main Topics  . Smoking status: Never Smoker   . Smokeless tobacco: Never Used  . Alcohol Use: 2.5 oz/week    5 drink(s) per week     Comment: social  . Drug Use: No  . Sexual Activity: Yes   Other Topics Concern  . Not on file   Social History Narrative  . No narrative on file    Medications:   Current Outpatient Prescriptions on File Prior to Visit  Medication Sig Dispense Refill  . b complex vitamins tablet Take 1 tablet by mouth daily.      [name] NASONEX 50 MCG/ACT nasal spray       . Nutritional Supplements (JUICE PLUS FIBRE PO) Take 2 capsules by mouth daily.      [name] omeprazole-sodium bicarbonate (ZEGERID) 40-1100 MG per capsule Take 1 capsule by mouth daily before breakfast.  30 capsule  6  . oxyCODONE-acetaminophen (PERCOCET/ROXICET) 5-325 MG per tablet Take 1-2 tablets by mouth every 4 (four) hours as needed for severe pain.  15 tablet  0  . PATANASE 0.6 % SOLN Place 1 each into the nose daily.       [name] VIAGRA 100 MG tablet TAKE (1) TABLET AS NEEDED.  10 tablet  11   No current facility-administered medications on file prior to visit.    Allergies:  No Known Allergies  Physical Exam General: well developed, well nourished, seated, in no evident distress Head: head normocephalic and atraumatic. Orohparynx benign Neck: supple with no carotid or supraclavicular bruits Cardiovascular: regular rate and rhythm, no murmurs Musculoskeletal: no  deformity Skin:  no rash/petichiae Vascular:  Normal pulses all extremities There were no vitals filed for this visit.  Neurologic Exam Mental Status: Awake and fully alert. Oriented to place and time. Recent and remote memory intact. Attention span, concentration and fund of knowledge appropriate. Mood and affect appropriate.  Cranial Nerves: Fundoscopic exam reveals sharp disc margins. Pupils equal, briskly reactive to light. Extraocular movements full without nystagmus. Visual fields full to confrontation. Hearing intact. Facial sensation intact. Face, tongue, palate moves normally and symmetrically.  Motor: Normal bulk and tone. Normal strength in all tested extremity muscles. Sensory.: intact to touch and pinprick and vibratory.  Coordination: Rapid alternating movements normal in all extremities. Finger-to-nose and heel-to-shin performed accurately bilaterally. Head shaking produced no subjective dizziness or objective nystagmus. Fukuda stepping test is positive with the patient clearly moving off the base and rotating. Hallpike manouvre was negative. Gait and Station: Arises from chair without difficulty. Stance is normal. Gait demonstrates normal stride length and balance . Able to heel, toe and tandem walk without difficulty.  Reflexes: 1+ and symmetric. Toes downgoing.     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 Prior history of similar episode suggestive of benign positional vertigo. Abnormal MRI scan of the brain apparently showing tiny hyperintensities in the cerebellum off unclear etiology. No significant known vascular risk factors. 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 I will review his prior  neurological workup and hospital in [place] and make final decision about further workup. Check MRA of the neck to evaluate for posterior circulation given history of abnormal MRI report showing possible cerebellar infarct. This patient does not have any significant vascular risk factors nor has he had any focal neurological symptoms and hence I would like to personally review the MRI films to make a determination as he may have nonspecific T2 hyperintensities and not actual infarcts. Refer to vestibular rehabilitation for dizziness exercises. Continue aspirin for stroke prevention. Return for followup in 6 weeks or call earlier if necessary   Note: This document was prepared with digital dictation and possible smart phrase technology. Any transcriptional errors that result from this process are unintentional.  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 MRA of the neck showed no significant stenosis. I have reviewed his films with  my colleague Dr. 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 I have communicated this to the patient.

## 2014-01-31 ENCOUNTER — Other Ambulatory Visit: Payer: Self-pay | Admitting: Neurology

## 2014-01-31 ENCOUNTER — Telehealth: Payer: Self-pay | Admitting: Neurology

## 2014-01-31 MED ORDER — DIAZEPAM 5 MG PO TABS: ORAL_TABLET | ORAL | Status: DC

## 2014-01-31 NOTE — Telephone Encounter (Signed)
Pt is scheduled for MRA tomorrow.  Pt will need driver when taking valium.  I faxed to [facility].  Pt aware.

## 2014-01-31 NOTE — Telephone Encounter (Signed)
Pt calling and valium fax did not go thru.   I called and spoke to Judson Roch at [facility] [place].  Looks like fax went thru but no number?  I called in to valium 5 mg tabs 1/2 tab every 8 hours prn vertigo.  # 15 with no refill.

## 2014-01-31 NOTE — Telephone Encounter (Addendum)
Pt called again.  Having trouble with dizziness, lightheadedness, vertigo, some nausea.   Asking to be seen today.   Dr. Leonie Man out of office.  I see that referral for vestibular rehab is being worked on.  [phone].  He stated that he took some meclizine and this is not working.  He had IVF and valium in the hospital when previously had problem.

## 2014-01-31 NOTE — Telephone Encounter (Signed)
I called [facility], looks like they tried to call.  I gave angie, his number, will also relay to pt.

## 2014-01-31 NOTE — Telephone Encounter (Signed)
Please let him know a prescription for valium 2.5mg  to be used up to 3 times a day was called in. Can we also confirm that he is scheduled for a MRA. Thanks.

## 2014-02-01 ENCOUNTER — Ambulatory Visit: Payer: 59 | Attending: Neurology | Admitting: Physical Therapy

## 2014-02-01 ENCOUNTER — Ambulatory Visit: Payer: 59

## 2014-02-01 DIAGNOSIS — R42 Dizziness and giddiness: Secondary | ICD-10-CM | POA: Diagnosis not present

## 2014-02-01 DIAGNOSIS — IMO0001 Reserved for inherently not codable concepts without codable children: Secondary | ICD-10-CM | POA: Diagnosis present

## 2014-02-02 ENCOUNTER — Telehealth: Payer: Self-pay | Admitting: Neurology

## 2014-02-02 NOTE — Telephone Encounter (Signed)
Patient calling back requesting another MD read MRA results, due to he need to hand deliver CD and Interpretation notes to another Doc appt on Tuesday in [place]. [place].  Please call and advise.

## 2014-02-02 NOTE — Telephone Encounter (Signed)
Patient calling for MRI results.  Please call anytime and may leave detailed message if not available.

## 2014-02-02 NOTE — Telephone Encounter (Signed)
Called the patient was unable to get a answer and was unable to leave a voice mail it was full.  His results was  Not yet read by Dr. Leonie Man.

## 2014-02-03 ENCOUNTER — Telehealth: Payer: Self-pay | Admitting: *Deleted

## 2014-02-03 NOTE — Telephone Encounter (Signed)
Called patient lvm  letting him know his MRA  ,Will be read today and ready to be giving Monday morning.

## 2014-02-03 NOTE — Telephone Encounter (Signed)
Called patient and left a voicemail of normal results.

## 2014-02-06 ENCOUNTER — Telehealth: Payer: Self-pay | Admitting: *Deleted

## 2014-02-06 NOTE — Telephone Encounter (Signed)
Called patient and let him know the information he requested is ready at the front desk.

## 2014-02-08 ENCOUNTER — Telehealth: Payer: Self-pay | Admitting: Neurology

## 2014-02-08 NOTE — Telephone Encounter (Signed)
Patient calling to check whether Dr. Leonie Man has made his final comments on his MRA images, please return call and advise.

## 2014-02-08 NOTE — Telephone Encounter (Signed)
Returning the patient phone call... I have called the patient on 02-03-2014 with results and also on 02-06-2014 letting him know everything is ready for pick up...both times I left a voicemail asking the patient to call me, he also stated it was ok to leave a detailed message which was done. I will try to call again at a later time.

## 2014-02-09 ENCOUNTER — Other Ambulatory Visit: Payer: Self-pay | Admitting: *Deleted

## 2014-02-09 MED ORDER — OMEPRAZOLE-SODIUM BICARBONATE 40-1100 MG PO CAPS: 1.0000 | ORAL_CAPSULE | Freq: Every day | ORAL | Status: DC

## 2014-02-10 NOTE — Telephone Encounter (Signed)
Patient calling again regarding this matter, states that he needs that summary written of his MRI and MRA so he can send the report to the [facility] today. Please return call and advise.

## 2014-02-13 NOTE — Telephone Encounter (Signed)
Patient came in today to see if Dr. Leonie Man reviewed his scan, that he had done when he was in the hospital in [place]. I called the hosp. To see if the scan was sent and it never was. They will send the scan via mail on 02-13-2014 they can not send it over night unless [facility] pay for it.

## 2014-02-13 NOTE — Telephone Encounter (Signed)
Pt here asked to see me.  I contacted Dr. Leonie Man who has not seen the CD MRI Brain from [facility] records has not seen any information for this pt come thru there office.  Richeda, came and spoke to pt.  Will call and see if CD sent or what happened.  Pt verbalized understanding.

## 2014-02-13 NOTE — Telephone Encounter (Signed)
Called patient and he wanted his MRI results from [place] and the MRA compared and for Dr. Leonie Man to come up with a diagnoses. 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

## 2014-02-13 NOTE — Telephone Encounter (Signed)
Pt calling again about the need for the report of MRI /MRA findings (comparison) for the [facility] MD/[facility] report.  Time sensitive for pt to get back to work.  Please, if by office today to address.  [phone].

## 2014-02-23 ENCOUNTER — Telehealth: Payer: Self-pay | Admitting: Internal Medicine

## 2014-02-23 NOTE — Telephone Encounter (Signed)
Pt states he has had abdominal pain for the past 3 days. Pt had diverticulosis on past colon. States he was not able to sleep well night before last due to the discomfort. Pt scheduled to see Tye Savoy NP tomorrow at 2:30pm. Pt aware of appt.

## 2014-02-24 ENCOUNTER — Encounter: Payer: Self-pay | Admitting: Nurse Practitioner

## 2014-02-24 ENCOUNTER — Ambulatory Visit (INDEPENDENT_AMBULATORY_CARE_PROVIDER_SITE_OTHER): Payer: 59 | Admitting: Nurse Practitioner

## 2014-02-24 VITALS — BP 110/68 | HR 72 | Ht 70.75 in | Wt 181.2 lb

## 2014-02-24 DIAGNOSIS — R1084 Generalized abdominal pain: Secondary | ICD-10-CM

## 2014-02-24 MED ORDER — OMEPRAZOLE-SODIUM BICARBONATE 40-1100 MG PO CAPS: 1.0000 | ORAL_CAPSULE | Freq: Every day | ORAL | Status: DC

## 2014-02-24 MED ORDER — GLYCOPYRROLATE 2 MG PO TABS: 2.0000 mg | ORAL_TABLET | Freq: Two times a day (BID) | ORAL | Status: DC

## 2014-02-24 NOTE — Patient Instructions (Addendum)
We have sent the following medications to your pharmacy for you to pick up at your convenience: [facility] has requested that you go to the basement for the following lab work on Monday. (Lab is open from 730 am-530 pm) CBC CMET

## 2014-02-24 NOTE — Progress Notes (Signed)
     History of Present Illness:   Patient is a 58 year old male known to Dr. Olevia Perches. He was seen in the office December 2014 after ED visit for mid to upper abdominal pain with negative labs and CTscan. He subsequently underwent EGD with findings of only mild distal esophageal erythema and several pinpoint erosions in the antrum.  Gastric biopsies compatible with focal erosion associated with mild inflammation. GE junction biopsies compatible with Barrett's esophagus.  Patient is here for evaluation of crampy lower abdominal pain. He isn't clear if this is same type pain as when in evaluated in ED in 2014 but it does seem lower this time. The pain started while patient was out of town working and last a few days. No BM changes except stools were a little looser than normal. No chills. No nausea. No urinary symptoms. Has actually felt much better over last couple of days. Appetite is good.    Current Medications, Allergies, Past Medical History, Past Surgical History, Family History and Social History were reviewed in [employer] record.   Physical Exam: General: Pleasant, well developed , white male in no acute distress Head: Normocephalic and atraumatic Eyes:  sclerae anicteric, conjunctiva pink  Ears: Normal auditory acuity Lungs: Clear throughout to auscultation Heart: Regular rate and rhythm Abdomen: Soft, non distended, non-tender. No masses, no hepatomegaly. Normal bowel sounds Musculoskeletal: Symmetrical with no gross deformities  Extremities: No edema  Neurological: Alert oriented x 4, grossly nonfocal Psychological:  Alert and cooperative. Normal mood and affect  Assessment and Recommendations:  Pleasant 58 year old male with recent episode of acute lower mid abdominal pain, now resolved. Doubt diverticulitis. Infectious? Other etiologies? Abdomen benign on exam today. No GI workup needed at present, especially since he has felt fine over last couple of  days. If patient has recurrent pain he will need to come back for reassessment. Patient not sure if he still has the Robinul Forte prescribed at last visit. I will refill it for him just in case.

## 2014-02-25 ENCOUNTER — Encounter: Payer: Self-pay | Admitting: Nurse Practitioner

## 2014-02-26 DIAGNOSIS — R1084 Generalized abdominal pain: Secondary | ICD-10-CM | POA: Insufficient documentation

## 2014-02-26 NOTE — Progress Notes (Signed)
Reviewed and agree. Positive F hx of colon cancer father. Due for recall colon 01/2015.

## 2014-03-07 ENCOUNTER — Encounter (INDEPENDENT_AMBULATORY_CARE_PROVIDER_SITE_OTHER): Payer: Self-pay

## 2014-03-07 ENCOUNTER — Encounter: Payer: Self-pay | Admitting: Neurology

## 2014-03-07 ENCOUNTER — Ambulatory Visit (INDEPENDENT_AMBULATORY_CARE_PROVIDER_SITE_OTHER): Payer: 59 | Admitting: Neurology

## 2014-03-07 VITALS — BP 106/65 | HR 65 | Wt 183.0 lb

## 2014-03-07 DIAGNOSIS — M79601 Pain in right arm: Secondary | ICD-10-CM

## 2014-03-07 NOTE — Patient Instructions (Signed)
I had a long discussion with the patient with regards to his MRA results which I personally reviewed and also reviewed the outside MRI scan of the brain done in [place]. Continue aspirin for stroke prevention. He has new complaints of right upper extremity intermittent pain and numbness we will evaluate this further by checking MRI scan of the cervical spine and EMG nerve conduction study. Return for followup in 3 months or earlier if necessary.

## 2014-03-07 NOTE — Progress Notes (Signed)
[facility] [address] [place]. [place] [address] [phone]       OFFICE FOLLOW UP VISIT NOTE  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 He noted right arm pain which usually precedes his typical migraine headache. He laid down and went to sleep but woke up several hours later with severe vertigo and nausea. He called ambulance and was seen in the ER [place]. And admitted. He had no focal neurological deficits on exam and was thought to have benign positional vertigo. MRI scan of the brain was done which did not reveal any acute abnormality.   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 He was treated with IV meclizine and fluids and Valium and improved briefly into his stay. Carotid Dopplers, CT angiogram of the brain were both normal. MRI scan of the cervical spine was obtained and he complained of some right upper extremity pain and it showed mild degenerative changes at C4-5 to C6-7 without any significant compression. Transthoracic echo was normal. Fasting lipid profile was normal and random glucose was 100 mg percent the patient denies any prior history of strokes or TIAs and he has not had any prior brain imaging studies done. He was seen in the emergency room at [facility] 2 years ago for a similar episode of vertigo. This was diagnosed as  benign positional vertigo. He was advised outpatient referral to vestibular rehabilitation but he did not make that appointment. He denies any ringing in his ears, hearing loss. He does not have significant vascular risk factors in the form of diabetes,  hypertension, hyperlipidemia or smoking. He feels he is completely back to normal. Update 03/07/2014 ; He returns for followup after last visit 6 weeks ago.[name] He is doing well without recurrent dizziness or any new focal symptoms. 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 Patient did undergo MRA of the brain and neck which also have personally reviewed and showed no significant intra-or extracranial stenosis. He has been cleared by [employer] to continue to work as a  Presenter, broadcasting. He has been having intermittent episodes of right upper extremity pain and heaviness with occasional numbness starting in the late 1990s. In the last few months he seems to have noted increased frequency of episodes occurring once a month or so. There is no apparent trigger. It is quite a sharp pain involving his right arm going up to the forearm. It radiates from the top of the shoulder down. This is accompanied by a feeling of lightheadedness and occasional pulsation in both ears. Denies any vertigo nausea or vision problems accompanying this. 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 injury. He has not had any recent neck imaging studies done. ROS:   14 system review of  systems is positive for dizziness only and all other systems negative  PMH:  Past Medical History  Diagnosis Date  . Hx of colonic polyps   . Headache(784.0)   . Meningitis spinal   . Vertigo   . Stroke     Social History:  History   Social History  . Marital Status: Legally Separated    Spouse Name: N/A    Number of Children: 2  . Years of Education: BA   Occupational History  . airline captain    Social History  Main Topics  . Smoking status: Never Smoker   . Smokeless tobacco: Never Used  . Alcohol Use: 2.5 oz/week    5 drink(s) per week     Comment: social  . Drug Use: No  . Sexual Activity: Yes   Other Topics Concern  . Not on file   Social History Narrative  . No narrative on file    Medications:   Current Outpatient Prescriptions on File Prior to Visit  Medication Sig Dispense Refill  . aspirin 81 MG chewable tablet Chew 81 mg by mouth daily.      [name] b complex vitamins tablet Take 1 tablet by mouth daily.      . diazepam (VALIUM) 5 MG tablet 1/2 tab (2.5mg ) q8hrs as needed for vertigo  15 tablet  0  . glycopyrrolate (ROBINUL) 2 MG tablet Take 1 tablet (2 mg total) by mouth 2 (two) times daily.  180 tablet  0  . NASONEX 50 MCG/ACT nasal spray       . Nutritional Supplements (JUICE PLUS FIBRE PO) Take 2 capsules by mouth daily.      [name] omeprazole-sodium bicarbonate (ZEGERID) 40-1100 MG per capsule Take 1 capsule by mouth daily before breakfast.  30 capsule  4  . oxyCODONE-acetaminophen (PERCOCET/ROXICET) 5-325 MG per tablet Take 1-2 tablets by mouth every 4 (four) hours as needed for severe pain.  15 tablet  0  . PATANASE 0.6 % SOLN Place 1 each into the nose daily.       [name] VIAGRA 100 MG tablet TAKE (1) TABLET AS NEEDED.  10 tablet  11   No current facility-administered medications on file prior to visit.    Allergies:  No Known Allergies  Physical Exam General: well developed, well nourished, seated, in no evident distress Head: head normocephalic and atraumatic. Orohparynx benign Neck: supple with no carotid or supraclavicular bruits Cardiovascular: regular rate and rhythm, no murmurs Musculoskeletal: no deformity Skin:  no rash/petichiae Vascular:  Normal pulses all extremities Filed Vitals:   03/07/14 1345  BP: 106/65  Pulse: 65    Neurologic Exam Mental Status: Awake and fully alert. Oriented to place and time. Recent and remote memory intact. Attention span,  concentration and fund of knowledge appropriate. Mood and affect appropriate.  Cranial Nerves: Fundoscopic exam reveals sharp disc margins. Pupils equal, briskly reactive to light. Extraocular movements full without nystagmus. Visual fields full to confrontation. Hearing intact. Facial sensation intact. Face, tongue, palate moves normally and symmetrically.  Motor: Normal bulk and tone. Normal strength in all tested extremity muscles. Sensory.: intact to touch and pinprick,position and vibratory sensation. Tinel's sign negative over right wrist.  Coordination: Rapid alternating movements normal in all extremities. Finger-to-nose and heel-to-shin performed accurately bilaterally. Head shaking produced no subjective dizziness or objective nystagmus. Fukuda stepping test is positive with the patient clearly moving off the base and rotating. Hallpike manouvre was negative. Gait and Station: Arises from chair without difficulty. Stance is normal.  Gait demonstrates normal stride length and balance . Able to heel, toe and tandem walk without difficulty.  Reflexes: 1+ and symmetric. Toes downgoing.     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 Prior history of similar episode suggestive of benign positional vertigo. 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 No significant known vascular risk factors. Remote history of migraine headaches which appear infrequent and stable. Chronic intermittent right arm and forearm pain of unclear etiology which needs evaluation    PLAN: I had a long discussion with the patient with regards to his MRA results which I personally reviewed and also reviewed the outside MRI scan of the brain done in [place]. Continue aspirin for stroke prevention. He has new complaints of right upper extremity intermittent pain and  numbness we will evaluate this further by checking MRI scan of the cervical spine and EMG nerve conduction study. Return for followup in 3 months or earlier if necessary.  Note: This document was prepared with digital dictation and possible smart phrase technology. Any transcriptional errors that result from this process are unintentional.

## 2014-03-14 ENCOUNTER — Encounter (INDEPENDENT_AMBULATORY_CARE_PROVIDER_SITE_OTHER): Payer: Self-pay

## 2014-03-14 ENCOUNTER — Ambulatory Visit (INDEPENDENT_AMBULATORY_CARE_PROVIDER_SITE_OTHER): Payer: 59 | Admitting: Neurology

## 2014-03-14 DIAGNOSIS — M79621 Pain in right upper arm: Secondary | ICD-10-CM

## 2014-03-14 DIAGNOSIS — M79601 Pain in right arm: Secondary | ICD-10-CM

## 2014-03-14 DIAGNOSIS — M25521 Pain in right elbow: Secondary | ICD-10-CM

## 2014-03-14 DIAGNOSIS — Z0289 Encounter for other administrative examinations: Secondary | ICD-10-CM

## 2014-03-14 NOTE — Procedures (Signed)
     HISTORY:  Wesley [name] is a 58 year old gentleman with a history of intermittent right shoulder and arm discomfort that began in the mid 1990s. The patient indicates to have one day of pain every 3 months or so. The patient is not clear that he has neck discomfort. He is being evaluated for possible neuropathy or a cervical radiculopathy.  NERVE CONDUCTION STUDIES:  Nerve conduction studies were performed on both upper extremities. The distal motor latencies and motor amplitudes for the median and ulnar nerves were within normal limits. The F wave latencies and nerve conduction velocities for these nerves were also normal. The sensory latencies for the median and ulnar nerves were normal.   EMG STUDIES:  EMG study was performed on the right upper extremity:  The first dorsal interosseous muscle reveals 2 to 4 K units with full recruitment. No fibrillations or positive waves were noted. The abductor pollicis brevis muscle reveals 2 to 4 K units with full recruitment. No fibrillations or positive waves were noted. The extensor indicis proprius muscle reveals 1 to 3 K units with full recruitment. No fibrillations or positive waves were noted. The pronator teres muscle reveals 2 to 3 K units with full recruitment. No fibrillations or positive waves were noted. The biceps muscle reveals 1 to 2 K units with full recruitment. No fibrillations or positive waves were noted. The triceps muscle reveals 2 to 4 K units with full recruitment. No fibrillations or positive waves were noted. The anterior deltoid muscle reveals 2 to 3 K units with full recruitment. No fibrillations or positive waves were noted. The cervical paraspinal muscles were tested at 2 levels. No abnormalities of insertional activity were seen at either level tested. There was good relaxation.   IMPRESSION:  Nerve conduction studies done on both upper extremities were within normal limits. No evidence of a neuropathy is seen.  EMG evaluation of the right upper extremity is unremarkable, without evidence of an overlying cervical radiculopathy.  Jill Alexanders MD 03/14/2014 1:58 PM  [facility] [address] [place], [place] [address]  Phone [phone] Fax [phone]

## 2014-03-15 ENCOUNTER — Telehealth: Payer: Self-pay | Admitting: Neurology

## 2014-03-15 NOTE — Telephone Encounter (Signed)
Patient was wanting results from MRI that was just done on 03/14/14, informed patient that Dr Leonie Man is out of the office this week, and has not had time to read MRI, informed him that as soon as results are available he will be contacted. Patient verbalized understanding.

## 2014-03-15 NOTE — Telephone Encounter (Signed)
Patient calling to request MRI results, please return call and advise.

## 2014-03-15 NOTE — Telephone Encounter (Signed)
Called patient back and left message to return my call.

## 2014-03-15 NOTE — Telephone Encounter (Signed)
Patient returned my call, however there was a problem with patient's phone and I could not hear the patient anymore, I stated to call the office back for results.

## 2014-03-16 ENCOUNTER — Other Ambulatory Visit: Payer: Self-pay | Admitting: Diagnostic Neuroimaging

## 2014-03-16 DIAGNOSIS — M79601 Pain in right arm: Secondary | ICD-10-CM

## 2014-03-20 NOTE — Telephone Encounter (Signed)
Patient's MRI shows abnormal, please advise

## 2014-03-20 NOTE — Telephone Encounter (Signed)
Patient calling again to check on his MRI results, please return call and advise, he is requesting that he hear back today.

## 2014-03-21 NOTE — Telephone Encounter (Signed)
I called the listed number and left message to call me back

## 2014-03-21 NOTE — Telephone Encounter (Signed)
Patient returning call to Dr. Leonie Man regarding results, please return call and advise.

## 2014-03-22 NOTE — Telephone Encounter (Signed)
Patient calling again to speak with Dr. Leonie Man regarding his results, please call him back and advise.

## 2014-03-23 NOTE — Telephone Encounter (Signed)
I finally spoke to the patient . I offered a trial of Lyrica for neuropathic pain but we will hold off on that for now. Call back if any further question spondylitic changes at C4-5 and C5-6 with foraminal narrowing on the right with likely impingement of of the right C5 nerve root. EMG/NCVstudy was normal. I recommend correct neck posture. Lyrica trial if pain worsens

## 2014-03-23 NOTE — Telephone Encounter (Signed)
Patient calling again to speak with Dr. Leonie Man, patient will be on an airplane to [place] in about an hour and will be unreachable. Please return call as soon as possible.

## 2014-03-23 NOTE — Telephone Encounter (Signed)
Patient has called for the past two days returning Dr Clydene Fake call, patient has been trying to get his MRI results since 10/21 please call patient today.

## 2014-03-23 NOTE — Telephone Encounter (Signed)
I called and left message on voice mail to call me

## 2014-04-17 ENCOUNTER — Telehealth: Payer: Self-pay | Admitting: Family Medicine

## 2014-04-17 DIAGNOSIS — R42 Dizziness and giddiness: Secondary | ICD-10-CM

## 2014-04-17 NOTE — Telephone Encounter (Signed)
Pt need a referral to [facility]

## 2014-04-17 NOTE — Telephone Encounter (Signed)
Type of Insurance:  Do we have a copy of Insurance Card on File?  Patient's PCP: [id number]  PCP's NPI:  Treating Provider: [facility]  Treating Provider's NPI:  Treating Provider's Phone Number:  [phone]  Treating Provider's Fax Number:  Reason for Visit (diagnosis):

## 2014-04-18 NOTE — Telephone Encounter (Signed)
Referral was done  

## 2014-04-18 NOTE — Telephone Encounter (Signed)
I left a voice message with below information. 

## 2014-06-13 ENCOUNTER — Ambulatory Visit: Payer: Self-pay | Admitting: Neurology

## 2014-06-15 ENCOUNTER — Telehealth: Payer: Self-pay | Admitting: *Deleted

## 2014-06-15 NOTE — Telephone Encounter (Signed)
Called patient and could not leave a message, so I called patient's spouse and informed her to have patient call back and r/s appointment due to the office will be closed on Friday 06/16/14 because of inclement weather. Appointment has been cancelled.

## 2014-06-16 ENCOUNTER — Ambulatory Visit: Payer: Self-pay | Admitting: Neurology

## 2014-06-29 ENCOUNTER — Other Ambulatory Visit: Payer: Self-pay | Admitting: Neurology

## 2014-06-29 ENCOUNTER — Telehealth: Payer: Self-pay | Admitting: *Deleted

## 2014-06-29 DIAGNOSIS — H8110 Benign paroxysmal vertigo, unspecified ear: Secondary | ICD-10-CM

## 2014-06-29 NOTE — Telephone Encounter (Signed)
Pt calling and would like a call back.  He wanted to be seen today or tomorrow or even Monday.  Has had exacerbation of similar sx of when was in before.  (vertigo).  This last week end was severe with not much relief from meclizine.  He is a [employer].  He is thinking he may need to take medical leave for this problem.  Since no available appt, would appreciate a call back.

## 2014-06-29 NOTE — Telephone Encounter (Signed)
I called the listed telephone number but was unable to leave a message as mail box is full. 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 I will order the referral but it needs to be done on an emergent basis.

## 2014-06-30 NOTE — Telephone Encounter (Signed)
noted 

## 2014-07-03 ENCOUNTER — Ambulatory Visit: Payer: 59 | Attending: Neurology | Admitting: Rehabilitative and Restorative Service Providers"

## 2014-07-03 DIAGNOSIS — R42 Dizziness and giddiness: Secondary | ICD-10-CM

## 2014-07-03 DIAGNOSIS — H8110 Benign paroxysmal vertigo, unspecified ear: Secondary | ICD-10-CM | POA: Diagnosis not present

## 2014-07-03 NOTE — Therapy (Addendum)
[facility] [address] [place], [place], [address] Phone: [phone]   Fax:  [phone]  Physical Therapy Evaluation  Patient Details  Name: Wesley [name] MRN: [id number] Date of Birth: 04-15-1956 Referring Provider:  Antony Contras, MD  Encounter Date: 07/03/2014      PT End of Session - 07/03/14 1217    Visit Number 1   Number of Visits 4   Date for PT Re-Evaluation 08/01/14   PT Start Time 1110   PT Stop Time 1210   PT Time Calculation (min) 60 min   Activity Tolerance Patient tolerated treatment well      Past Medical History  Diagnosis Date  . Hx of colonic polyps   . Headache(784.0)   . Meningitis spinal   . Vertigo   . Stroke     Past Surgical History  Procedure Laterality Date  . Colonoscopy  02/14/10    per Dr. Olevia Perches, polyps, repeat in 5 yrs  . Polypectomy    . Wisdom tooth extraction      There were no vitals taken for this visit.  Visit Diagnosis:  Dizziness and giddiness      Subjective Assessment - 07/03/14 1116    Symptoms The patient had a sudden onset of vertigo in 12/2013 while in [place].  He had a prior h/o vertigo intermittent and episodic in nature and reports hospitalization 2 years ago and then again in 12/2013. 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  He is currently experiencing a low grade " sluggish" dysequilibrium.  He avoids quick movements and feels a "fogginess".  He describes this currently as a constant sensation.  If he moves his head, he feels worse.  Mornings are worse and symptoms are improved  throughout the day.    Currently in Pain? No/denies          [facility] PT Assessment - 07/03/14 0001    Assessment   Medical Diagnosis BPPV   Balance Screen   Has the patient fallen in the past 6 months No   Has the patient had a decrease in activity level because of a fear of falling?  No   Is the patient reluctant to leave their home  because of a fear of falling?  No   Home Environment   Living Enviornment Private residence   Type of [place] Two level   Prior Function   Vocation On disability   Vocation Requirements --  pilot for airline   Observation/Other Assessments   Focus on Therapeutic Outcomes (FOTO)  60%   Other Surveys  --  DHI=18% (mild severity level)   Ambulation/Gait   Ambulation/Gait Yes   Ambulation/Gait Assistance 7: Independent   Ambulation Distance (Feet) --  community surfaces   Gait Pattern Within Functional Limits   Gait velocity --  appears normal   Standardized Balance Assessment   Balance Master Testing Sensory Organization Test   Balance Master Testing    Results 78% composite equilibrium score  WNLs on use of somatosensory, visual and vestibular feedback            Vestibular Assessment - 07/03/14 1128    Type of Dizziness "Funny feeling in head"  rated 6/10 at rest today   Frequency of Dizziness --  daily   Duration of Dizziness --  hours   Aggravating Factors Turning head quickly   Relieving Factors Rest   Occulomotor Alignment Normal  Spontaneous Absent   Gaze-induced Absent   Saccades Intact   Comment --  head thrust test=L WNLs, R mild corrective saccade noted   VOR 1 Head Only (x 1 viewing) --  slow pace WNLs   Dynamic --  Static versus dynamic visual acuity=2 line difference WNLs.   Dix-Hallpike Dix-Hallpike Right;Dix-Hallpike Left   Dix-Hallpike Right Symptoms No nystagmus   Dix-Hallpike Left Symptoms No nystagmus       NEUROMUSCULAR RE-EDUCATION: Gaze x 1 viewing in horizontal and vertical planes. Seated habituation horizontal head turns *see HEP  SELF CARE/HOME MANAGEMENT: Discussed increasing frequency and speed of head movements throughout daily activities (looking up/down while on grocery store isle, side to side in busy environments as in scanning a room, turning head while walking for exercise).        PT Education -  07/03/14 1217    Education provided Yes   Education Details HEP: seated horizontal head turns, gaze x 1 viewing horizontal and vertical planes   Person(s) Educated Patient   Methods Explanation;Demonstration;Handout   Comprehension Returned demonstration;Verbalized understanding             PT Long Term Goals - 07/03/14 1224    PT LONG TERM GOAL #1   Title The patient will be indep with HEP for gaze adaptation and habituation.  Target date 08/01/2014.   Time 4   Period Weeks   PT LONG TERM GOAL #2   Title The patient will decrease DHI by 8% (from 18% to < or equal to 10%) for subjective improvement in symptoms.  Target date 08/01/2014.   Time 4   Period Weeks               Plan - 07/03/14 1218    Clinical Impression Statement The patient is a 59 yo male presenting today with constant sensation of "fogginess" that is worse in the morning.  He has h/o episodic, intermittent vertigo that has been severe and required hospitalization 2 years ago, and then in 12/2013.  He is currently not experiencing a room spinning sensation, but a general fatigue with fogginess.  During today's evaluation, [name] was WNLs on sensory organization testing for use of vestibular inputs, negative positional testing, and WNLs static vs dynamic visual acuity.  With gaze x 1 exercise x 30 seconds, he does experience minimal visual movement at slower speeds and mild sensation of increased symptoms with seated horizontal head turns.  PT recommended he begin with 2 HEP to address his specific impairments and f/u in 2 weeks to see if further treatment indicated.  The patient's history of more severe episodes of vertigo may be consistent with h/o vestibular neuritis with recent sleep disturbance creating a central decompensation for the neuritis, or possibly some contributing factors from h/o migraines or abnormal MRI.  PT to focus on educating patient in self management of symptoms and progressing HEP as indicated.    Pt will benefit from skilled therapeutic intervention in order to improve on the following deficits Decreased activity tolerance;Difficulty walking;Postural dysfunction;Decreased mobility   Rehab Potential Good   PT Frequency 1x / week   PT Duration 4 weeks   PT Treatment/Interventions Therapeutic activities;Functional mobility training;Gait training;Therapeutic exercise;Patient/family education;Stair training;Balance training;Neuromuscular re-education;Other (comment)  vestibular rehabilitation   PT Next Visit Plan Review HEP, discuss subjective reports of symptoms, progress gaze x 1 viewing if indicated.   Consulted and Agree with Plan of Care Patient         Problem List Patient Active  Problem List   Diagnosis Date Noted  . Generalized abdominal pain 02/26/2014  . 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 GERD (gastroesophageal reflux disease) 07/20/2013  . Vertigo 06/15/2012  . ACUTE SINUSITIS, UNSPECIFIED 08/06/2009  . 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 HEADACHE 03/10/2007  . COLONIC POLYPS, HX OF 03/10/2007    Gisele Pack, PT 07/03/2014, 12:27 PM  [facility] [address] [place], [place], [address] Phone: [phone]   Fax:  [phone]

## 2014-07-03 NOTE — Patient Instructions (Addendum)
Gaze Stabilization: Tip Card 1.Target must remain in focus, not blurry, and appear stationary while head is in motion. 2.Perform exercises with small head movements (45 to either side of midline). 3.Increase speed of head motion so long as target is in focus. 4.If you wear eyeglasses, be sure you can see target through lens (therapist will give specific instructions for bifocal / progressive lenses). 5.These exercises may provoke dizziness or nausea. Work through these symptoms. If too dizzy, slow head movement slightly. Rest between each exercise. 6.Exercises demand concentration; avoid distractions. 7.For safety, perform standing exercises close to a counter, wall, corner, or next to someone.  Copyright  VHI. All rights reserved.  Gaze Stabilization: Standing Feet Apart   Feet shoulder width apart, keeping eyes on target on wall _3-5___ feet away, tilt head down slightly and move head side to side for __30__ seconds. Repeat while moving head up and down for _30___ seconds. Do _3___ sessions per day. Repeat using target on pattern background.  Copyright  VHI. All rights reserved.  Head Motion: Side to Side   Sitting, tilt head down and move head to right with eyes open. Then move to the left.  Repeat __5__ times per session. Do _2-3___ sessions per day.  Copyright  VHI. All rights reserved.

## 2014-07-04 ENCOUNTER — Encounter: Payer: Self-pay | Admitting: Family Medicine

## 2014-07-04 ENCOUNTER — Ambulatory Visit (INDEPENDENT_AMBULATORY_CARE_PROVIDER_SITE_OTHER): Payer: 59 | Admitting: Family Medicine

## 2014-07-04 VITALS — BP 111/65 | HR 62 | Temp 98.8°F | Ht 70.75 in | Wt 184.0 lb

## 2014-07-04 DIAGNOSIS — I639 Cerebral infarction, unspecified: Secondary | ICD-10-CM

## 2014-07-04 DIAGNOSIS — H8193 Unspecified disorder of vestibular function, bilateral: Secondary | ICD-10-CM

## 2014-07-04 DIAGNOSIS — H8190 Unspecified disorder of vestibular function, unspecified ear: Secondary | ICD-10-CM | POA: Insufficient documentation

## 2014-07-04 NOTE — Progress Notes (Signed)
Pre visit review using our clinic review tool, if applicable. No additional management support is needed unless otherwise documented below in the visit note. 

## 2014-07-04 NOTE — Progress Notes (Signed)
   Subjective:    Patient ID: Wesley [name], male    DOB: 11-22-1955, 59 y.o.   MRN: [id number]  HPI Here asking for advice about his vertigo and his ability to work. 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 He has tried to work but has not been able to work. 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 He is currently participating in a vestibular rehab program which was ordered by Dr. Leonie Man. Wesley [name] has been debating whether to apply for long term disability. He is thinking about switching careers and even about going back to graduate school to get an [facility].    Review of Systems  Constitutional: Negative.   Respiratory: Negative.   Cardiovascular: Negative.   Neurological: Positive for dizziness. Negative for tremors, seizures, syncope, facial asymmetry, speech difficulty, weakness, light-headedness, numbness and headaches.       Objective:   Physical Exam  Constitutional: He is oriented to person, place, and time. He appears well-developed and well-nourished. No distress.  Cardiovascular: Normal rate, regular rhythm, normal heart sounds and intact distal pulses.   Pulmonary/Chest: Effort normal and breath sounds normal.  Neurological: He is alert and oriented to person, place, and time. He has normal reflexes. No cranial nerve deficit. He exhibits normal muscle tone. Coordination normal.          Assessment & Plan:  I advised Wesley [name] that he probably will never be able to fly a plane again and that he should apply for disability, first short term and then long term. I think it would be wonderful for him to go back to school and to pursue a different career. We will fill out FMLA forms etc to get this process started. His first day of disability will be noted as 07-01-14.

## 2014-07-06 DIAGNOSIS — Z0279 Encounter for issue of other medical certificate: Secondary | ICD-10-CM

## 2014-07-14 ENCOUNTER — Ambulatory Visit: Payer: 59 | Admitting: Rehabilitative and Restorative Service Providers"

## 2014-07-14 ENCOUNTER — Other Ambulatory Visit (INDEPENDENT_AMBULATORY_CARE_PROVIDER_SITE_OTHER): Payer: 59

## 2014-07-14 DIAGNOSIS — R42 Dizziness and giddiness: Secondary | ICD-10-CM

## 2014-07-14 DIAGNOSIS — Z0000 Encounter for general adult medical examination without abnormal findings: Secondary | ICD-10-CM

## 2014-07-14 LAB — CBC WITH DIFFERENTIAL/PLATELET
BASOS ABS: 0.1 10*3/uL (ref 0.0–0.1)
Basophils Relative: 0.7 % (ref 0.0–3.0)
Eosinophils Absolute: 0.8 10*3/uL — ABNORMAL HIGH (ref 0.0–0.7)
Eosinophils Relative: 11.3 % — ABNORMAL HIGH (ref 0.0–5.0)
HCT: 45.4 % (ref 39.0–52.0)
HEMOGLOBIN: 15.6 g/dL (ref 13.0–17.0)
LYMPHS PCT: 26.6 % (ref 12.0–46.0)
Lymphs Abs: 1.9 10*3/uL (ref 0.7–4.0)
MCHC: 34.3 g/dL (ref 30.0–36.0)
MCV: 87.7 fl (ref 78.0–100.0)
MONOS PCT: 8.8 % (ref 3.0–12.0)
Monocytes Absolute: 0.6 10*3/uL (ref 0.1–1.0)
NEUTROS PCT: 52.6 % (ref 43.0–77.0)
Neutro Abs: 3.8 10*3/uL (ref 1.4–7.7)
Platelets: 253 10*3/uL (ref 150.0–400.0)
RBC: 5.18 Mil/uL (ref 4.22–5.81)
RDW: 13.8 % (ref 11.5–15.5)
WBC: 7.3 10*3/uL (ref 4.0–10.5)

## 2014-07-14 LAB — BASIC METABOLIC PANEL
BUN: 22 mg/dL (ref 6–23)
CO2: 29 mEq/L (ref 19–32)
Calcium: 9.5 mg/dL (ref 8.4–10.5)
Chloride: 105 mEq/L (ref 96–112)
Creatinine, Ser: 0.97 mg/dL (ref 0.40–1.50)
GFR: 84.93 mL/min (ref >60.00)
GLUCOSE: 51 mg/dL — AB (ref 70–99)
POTASSIUM: 4.2 meq/L (ref 3.5–5.1)
SODIUM: 141 meq/L (ref 135–145)

## 2014-07-14 LAB — HEPATIC FUNCTION PANEL
ALT: 22 U/L (ref 0–53)
AST: 20 U/L (ref 0–37)
Albumin: 4.2 g/dL (ref 3.5–5.2)
Alkaline Phosphatase: 47 U/L (ref 39–117)
BILIRUBIN TOTAL: 0.5 mg/dL (ref 0.2–1.2)
Bilirubin, Direct: 0.1 mg/dL (ref 0.0–0.3)
TOTAL PROTEIN: 6.5 g/dL (ref 6.0–8.3)

## 2014-07-14 LAB — POCT URINALYSIS DIP (MANUAL ENTRY)
BILIRUBIN UA: NEGATIVE
Bilirubin, UA: NEGATIVE
Blood, UA: NEGATIVE
Glucose, UA: NEGATIVE
LEUKOCYTES UA: NEGATIVE
Nitrite, UA: NEGATIVE
PH UA: 5.5
Protein Ur, POC: NEGATIVE
Spec Grav, UA: <=1.005
Urobilinogen, UA: 0.2

## 2014-07-14 LAB — PSA: PSA: 1.41 ng/mL (ref 0.10–4.00)

## 2014-07-14 LAB — LIPID PANEL
CHOL/HDL RATIO: 3
Cholesterol: 164 mg/dL (ref 0–200)
HDL: 57.4 mg/dL (ref >39.00)
LDL Cholesterol: 78 mg/dL (ref 0–99)
NonHDL: 106.6
Triglycerides: 142 mg/dL (ref 0.0–149.0)
VLDL: 28.4 mg/dL (ref 0.0–40.0)

## 2014-07-14 LAB — TSH: TSH: 2.05 u[IU]/mL (ref 0.35–4.50)

## 2014-07-14 NOTE — Patient Instructions (Signed)
Feet Apart (Compliant Surface) Head Motion - Eyes Closed   Stand on compliant surface: __sofa cushion or pillow  with feet shoulder width apart. Close eyes and move head slowly, side to side x 10 times.  Repeat with head up and down and diagonally.  Perform 1x/day. Copyright  VHI. All rights reserved.

## 2014-07-14 NOTE — Therapy (Addendum)
[facility] [address] [place], [place], [address] Phone: [phone]   Fax:  [phone]  Physical Therapy Treatment  Patient Details  Name: Wesley [name] MRN: [id number] Date of Birth: [birth year] Referring Provider:  Laurey Morale, MD  Encounter Date: 07/14/2014      PT End of Session - 07/14/14 1230    Visit Number 2   Number of Visits 4   Date for PT Re-Evaluation 08/01/14   PT Start Time 0934   PT Stop Time 1012   PT Time Calculation (min) 38 min   Activity Tolerance Patient tolerated treatment well   Behavior During Therapy [facility] for tasks assessed/performed      Past Medical History  Diagnosis Date  . Hx of colonic polyps   . Headache(784.0)   . Meningitis spinal   . Vertigo   . Stroke     Past Surgical History  Procedure Laterality Date  . Colonoscopy  02/14/10    per Dr. Olevia Perches, polyps, repeat in 5 yrs  . Polypectomy    . Wisdom tooth extraction      There were no vitals taken for this visit.  Visit Diagnosis:  Dizziness and giddiness      Subjective Assessment - 07/14/14 1222    Symptoms The patient c/o general "foggy" feeling. He reported on recent ski trip that he is able to ski when there is a clear horizon on a sunny day, but during snow storm when he does not have visual frame of reference, he has nausea and is unable to participate in skiing.   Currently in Pain? No/denies     SELF CARE/HOME MANAGEMENT: PT and patient had a discussion re: symptoms and possibility of multiple contributing factors.  He c/o 2 separate sensations at this time: 1) A general lightheaded, "foggy" sensation when he gets up in the morning.  His BP today with automatic cuff L UE was 105/69.  He reports a h/o hypotension.  PT recommended he discuss this wit his MD as a possible contributing factor to the sensation he experiences each morning.  I provided him with the handout from CDC on postural  hypotension.  2) Nausea noted when he loses a visual horizon during outdoor activities.  This may be more related to h/o inner ear/vertigo issues experienced 2 times in the past 2 years.  He may be more visually reliant (compared to vestibular and somatosensory use) and therefore, struggles when he does not have adequate visual input for balance.  PT discussed this possibility and recommended he perform home exercises on foam with altered visual cues (including low light in room or sunglasses).     NEUROMUSCULAR RE-EDUCATION: Added HEP for foam with eyes closed and head turns.  Recommended he continue to perform gaze adaptation exercises to strengthen use of vestibular inputs.                       PT Education - 07/14/14 1230    Education provided Yes   Education Details HEP: foam with eyes closed and head movement in all planes.  Standing gaze exercises.  CDC handout on hypotension.   Person(s) Educated Patient   Methods Explanation;Demonstration;Handout   Comprehension Returned demonstration;Verbalized understanding             PT Long Term Goals - 07/03/14 1224    PT LONG TERM GOAL #1   Title The patient will be indep with HEP for gaze adaptation and  habituation.  Target date 08/01/2014.   Time 4   Period Weeks   PT LONG TERM GOAL #2   Title The patient will decrease DHI by 8% (from 18% to < or equal to 10%) for subjective improvement in symptoms.  Target date 08/01/2014.   Time 4   Period Weeks               Plan - 07/14/14 1231    Clinical Impression Statement The patient appears to have multiple factors/environments that contribute to symptoms of fogginess and nausea.  PT recommended he further discuss low blood pressure with MD at upcoming annual physical.  PT to see for 1 more visit to check HEP, discuss other functional training, and recommend post d/c community activities.   PT Next Visit Plan review HEP and d/c, recommend tai chi? for post d/c  community program   Consulted and Agree with Plan of Care Patient        Problem List Patient Active Problem List   Diagnosis Date Noted  . 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 Generalized abdominal pain 02/26/2014  . 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 GERD (gastroesophageal reflux disease) 07/20/2013  . Vertigo 06/15/2012  . ACUTE SINUSITIS, UNSPECIFIED 08/06/2009  . 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 HEADACHE 03/10/2007  . COLONIC POLYPS, HX OF 03/10/2007    Latonda Larrivee, PT 07/14/2014, 12:34 PM  [facility] [address] [place], [place], [address] Phone: [phone]   Fax:  [phone]

## 2014-07-19 ENCOUNTER — Ambulatory Visit: Payer: 59 | Admitting: Rehabilitative and Restorative Service Providers"

## 2014-07-19 DIAGNOSIS — R42 Dizziness and giddiness: Secondary | ICD-10-CM | POA: Diagnosis not present

## 2014-07-19 NOTE — Therapy (Signed)
[facility] [address] [place], [place], [address] Phone: [phone]   Fax:  [phone]  Physical Therapy Treatment  Patient Details  Name: Wesley [name] MRN: [id number] Date of Birth: 05/19/1956 Referring Provider:  Fry, Stephen A, MD  Encounter Date: 07/19/2014      PT End of Session - 07/19/14 1423    Visit Number 3   Number of Visits 4   Date for PT Re-Evaluation 08/01/14   PT Start Time 1100   PT Stop Time 1130   PT Time Calculation (min) 30 min   Activity Tolerance Patient tolerated treatment well   Behavior During Therapy [facility] for tasks assessed/performed      Past Medical History  Diagnosis Date  . Hx of colonic polyps   . Headache(784.0)   . Meningitis spinal   . Vertigo   . Stroke     Past Surgical History  Procedure Laterality Date  . Colonoscopy  02/14/10    per Dr. Brodie, polyps, repeat in 5 yrs  . Polypectomy    . Wisdom tooth extraction      There were no vitals taken for this visit.  Visit Diagnosis:  Dizziness and giddiness      Subjective Assessment - 07/19/14 1105    Symptoms The patient reports HEP does not recreate symptoms.  No episodes of dizziness since ski trip. He continues with same foggy sensation upon waking.  He feels like symptoms are random in nature.  He had 2 days of good days, and today is not as good from a subjective standpoint.      SELF CARE/HOME MANAGEMENT: PT and patient discussed symptoms and plan.  PT recommended he determine if exercises increase sensation of fogginess by holding exercises x 3 days to see if symptoms change.  Then re-introduce exercises (1 at a time) to determine if they aggravate symptoms.  PT to place patient on hold and he can call to return within next 2-3 weeks if symptoms change. If no contact from patient, will d/c in 2-3 weeks.  Patient seems most aggravated by environments where he does not have adequate visual inputs (no  horizon while driving or during snow skiing).                            PT Long Term Goals - 07/19/14 1425    PT LONG TERM GOAL #1   Title The patient will be indep with HEP for gaze adaptation and habituation.  Target date 08/01/2014.   Baseline met on 07/19/2014   Time 4   Period Weeks   Status Achieved   PT LONG TERM GOAL #2   Title The patient will decrease DHI by 8% (from 18% to < or equal to 10%) for subjective improvement in symptoms.  Target date 08/01/2014.   Baseline Not re-tested at this time.   Time 4   Period Weeks   Status Deferred               Plan - 07/19/14 1423    Clinical Impression Statement The patient is uncertain if exercises are aggravating current symptoms.  PT to hold chart open until after MD visit this week with Dr. Frye and allow patinet 2-3 weeks to try exercises to determine if any change or consistent pattern noted.   PT Next Visit Plan f/u if needed or d/c in 2-3 weeks, depending on patient needs (pt to call)     Consulted and Agree with Plan of Care Patient        Problem List Patient Active Problem List   Diagnosis Date Noted  . 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 Generalized abdominal pain 02/26/2014  . 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 GERD (gastroesophageal reflux disease) 07/20/2013  . Vertigo 06/15/2012  . ACUTE SINUSITIS, UNSPECIFIED 08/06/2009  . 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 HEADACHE 03/10/2007  . COLONIC POLYPS, HX OF 03/10/2007    Dlisa Barnwell, PT 07/19/2014, 2:26 PM  [facility] [address] [place], [place], [address] Phone: [phone]   Fax:  [phone]

## 2014-07-21 ENCOUNTER — Ambulatory Visit (INDEPENDENT_AMBULATORY_CARE_PROVIDER_SITE_OTHER): Payer: BLUE CROSS/BLUE SHIELD | Admitting: Family Medicine

## 2014-07-21 ENCOUNTER — Encounter: Payer: Self-pay | Admitting: Family Medicine

## 2014-07-21 ENCOUNTER — Telehealth: Payer: Self-pay | Admitting: Internal Medicine

## 2014-07-21 VITALS — BP 109/57 | HR 55 | Temp 98.1°F | Ht 70.75 in | Wt 190.0 lb

## 2014-07-21 DIAGNOSIS — E162 Hypoglycemia, unspecified: Secondary | ICD-10-CM

## 2014-07-21 DIAGNOSIS — Z0000 Encounter for general adult medical examination without abnormal findings: Secondary | ICD-10-CM | POA: Diagnosis not present

## 2014-07-21 NOTE — Progress Notes (Signed)
Pre visit review using our clinic review tool, if applicable. No additional management support is needed unless otherwise documented below in the visit note. 
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
## 2014-07-24 NOTE — Telephone Encounter (Signed)
Left a message for patient to call back. 

## 2014-07-24 NOTE — Telephone Encounter (Signed)
Spoke with patient and he is concerned about side effects of Zegrid. 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 Patient is due in the fall for EGD and in Dec. For colonoscopy. He would like an OV to discuss medication and procedure. Scheduled on March 16 at 10:00 AM with Dr. Olevia Perches.

## 2014-07-27 ENCOUNTER — Encounter: Payer: Self-pay | Admitting: Dietician

## 2014-07-27 ENCOUNTER — Telehealth: Payer: Self-pay | Admitting: Family Medicine

## 2014-07-27 ENCOUNTER — Encounter: Payer: BLUE CROSS/BLUE SHIELD | Attending: Family Medicine | Admitting: Dietician

## 2014-07-27 DIAGNOSIS — Z713 Dietary counseling and surveillance: Secondary | ICD-10-CM | POA: Insufficient documentation

## 2014-07-27 DIAGNOSIS — E162 Hypoglycemia, unspecified: Secondary | ICD-10-CM

## 2014-07-27 DIAGNOSIS — R739 Hyperglycemia, unspecified: Secondary | ICD-10-CM

## 2014-07-27 NOTE — Patient Instructions (Addendum)
Contact your primary doctor and request that he prescribe you supplies for the OneTouch UltraMini.  You will need lancets and testing strips to test 3 times per day. At your next doctor's visit request to have an HgA1c blood test performed.  This provides an estimated average glucose over the last 3 months. Recommend checking blood sugar first thing in the morning before breakfast, and whenever you are feeling symptoms of hypoglycemia. Follow recommended meal plan: choosing 4-5 carb choices at each meal and always pair with a protein food.

## 2014-07-27 NOTE — Progress Notes (Signed)
Medical Nutrition Therapy:  Appt start time: 0800 end time:  0900.   Assessment:  Primary concerns today: hypoglycemia. Patient has hx of vertigo. He reports having light-headedness and/or headache every morning and sometimes throughout the day for the past year.   He states it was a gradual occurrence and that sometimes it is bad enough where he has to sit down until the fatigue/dizziness passes.  He states it is sometimes accompanied with feelings of hunger or blurred vision and denies other symptoms of hypoglycemia such as sweating or clamminess.  At a recent doctor's visit for these symptoms his post-prandial blood glucose reading was 51, following a breakfast of eggs, toast, and coffee with sugar.  The only family history of diabetes he states was his grandfather.  Patient has few other risk factors for T2DM as he is at a normal BMI and exercises regularly.  Patient denies symptoms of hyperglycemia and has not had a HgA1c test.  Patient may be experiencing reactive hypoglycemia.  He denies skipping meals and reports that he eats 3 regular meals a day and rarely snacks in between meals.  Patient reports being light-headed this morning and had eaten his usual breakfast.  His post-prandial BG reading today in my office at 8:25 am was 106.     Preferred Learning Style:  No preference indicated   Learning Readiness:  Ready  MEDICATIONS: see list   DIETARY INTAKE:  Usual eating pattern includes 3 meals and 0-1 snacks per day.  Estimated energy needs: 2000-2200 calories 225-245 g carbohydrates  Progress Towards Goal(s): not yet started   Nutritional Diagnosis:  [place]-2.2 Altered nutrition-related laboratory As related to hypoglycemia.  As evidenced by history of blood glucose readings 70mg /dL .    Intervention:  Nutrition education and counseling.  Described hypoglycemia and listed the symptoms of hypoglycemia.  Described the different types of diabetes and risk factors for diabetes.   Described reactive hypoglycemia and stated that the nutrition therapy for this is to eat a consistent amount of carbohydrates (preferably those with more fiber and less added sugar) at each meal and snack and always pair these foods with a protein food to stabilize our blood sugar reaction.  Recommended he aim for 4-5 carb choices (60-75 grams) at each meal to maintain his weight and help prevent reactive hypoglycemia.  Provided meal plan card and food lists to help him do this.  Demonstrated how to read the Nutrition Fact Label for Total Carbohydrates.  Patient denies symptoms of hypoglycemia after exercise.  Stated how to treat hypoglycemia with the Rule of 15.  Provided handout and listed options of foods/drinks containing 15 grams of fast-acting carbohydrate that he can use to treat a low blood sugar.  Provided him with a glucometer and instructions to have his doctor prescribe him lancet and testing strip supplies.  Recommended he check his fasting blood glucose each morning since this is when he most often has hypoglycemic symptoms.  Also recommend he test any time he feels hypoglycemic and follow the Rule of 15 if his BG reading is less than 70 mg/dL.  Recommended patient follow-up with his doctor to have an HgA1c test done to assess for diabetes, as he is over age 59 and has some family history.   Patient Instructions: Contact your primary doctor and request that he prescribe you supplies for the OneTouch UltraMini.  You will need lancets and testing strips to test 3 times per day. At your next doctor's visit request to have an HgA1c  blood test performed.  This provides an estimated average glucose over the last 3 months. Recommend checking blood sugar first thing in the morning before breakfast, and whenever you are feeling symptoms of hypoglycemia. Follow recommended meal plan: choosing 4-5 carb choices at each meal and always pair with a protein food.   Teaching Method  Utilized: Visual Auditory Hands on  Handouts given during visit include:  Hypoglycemia Symptoms and the Rule of 15  Yellow Card Carbohydrate Meal Planner and Food Lists  Barriers to learning/adherence to lifestyle change: none  Demonstrated degree of understanding via:  Teach Back   Monitoring/Evaluation:  Dietary intake, exercise, labs, and body weight prn.  Business card provided to patient for follow-up.

## 2014-07-27 NOTE — Telephone Encounter (Signed)
Pt saw nutrition today and she would like pt to have A1C test. Can I sch?

## 2014-07-28 ENCOUNTER — Other Ambulatory Visit (INDEPENDENT_AMBULATORY_CARE_PROVIDER_SITE_OTHER): Payer: BLUE CROSS/BLUE SHIELD

## 2014-07-28 DIAGNOSIS — R739 Hyperglycemia, unspecified: Secondary | ICD-10-CM

## 2014-07-28 LAB — HEMOGLOBIN A1C: Hgb A1c MFr Bld: 5.7 % (ref 4.6–6.5)

## 2014-07-28 NOTE — Telephone Encounter (Signed)
I agree. I put in the order so he can schedule the draw

## 2014-07-28 NOTE — Telephone Encounter (Signed)
Mailbox full

## 2014-07-28 NOTE — Telephone Encounter (Signed)
Lab scheduled °

## 2014-08-09 ENCOUNTER — Ambulatory Visit (INDEPENDENT_AMBULATORY_CARE_PROVIDER_SITE_OTHER): Payer: BLUE CROSS/BLUE SHIELD | Admitting: Internal Medicine

## 2014-08-09 ENCOUNTER — Ambulatory Visit: Payer: 59 | Admitting: Internal Medicine

## 2014-08-09 ENCOUNTER — Encounter: Payer: Self-pay | Admitting: Internal Medicine

## 2014-08-09 VITALS — BP 110/60 | HR 60 | Ht 70.75 in | Wt 187.0 lb

## 2014-08-09 DIAGNOSIS — K2270 Barrett's esophagus without dysplasia: Secondary | ICD-10-CM

## 2014-08-09 MED ORDER — RANITIDINE HCL 150 MG PO TABS: 150.0000 mg | ORAL_TABLET | Freq: Every day | ORAL | Status: DC

## 2014-08-09 NOTE — Patient Instructions (Addendum)
As discussed with Dr. Olevia Perches, discontinue your Zegerid  We have sent the following medications to your pharmacy for you to pick up at your convenience:  Zantac  Dr Alysia Penna

## 2014-08-09 NOTE — Progress Notes (Signed)
Wesley [name] [birth year] [id number]  Note: This dictation was prepared with Dragon digital system. Any transcriptional errors that result from this procedure are unintentional.   History of Present Illness: This is a 59 year old white male with new diagnosis of Barrett's esophagus made on upper endoscopy in December 2014 he has 2 cm hiatal hernia with increased fibrosis. Biopsies showed goblet cell metaplasia. He has been on Zegerid daily. He is complaining of dizziness and episodes of fall complete instability. He was hospitalized and seen by [place] several specialists for this and he is wondering if Zegerid could be contributing to this. We have seen in the past for colorectal screening. He had a colonoscopy in September 2011 because father's history of colon cancer. He had 2 hyperplastic polyps. Upper abdominal ultrasound showed tiny gallbladder polyp and non-shadowing stones. Common bile duct was 4.5 mm    Past Medical History  Diagnosis Date  . Hx of colonic polyps   . Headache(784.0)   . Meningitis spinal   . Vertigo   . Stroke     Past Surgical History  Procedure Laterality Date  . Colonoscopy  02/14/10    per Dr. Olevia Perches, polyps, repeat in 5 yrs  . Polypectomy    . Wisdom tooth extraction      No Known Allergies  Family history and social history have been reviewed.  Review of Systems: Denies dysphagia. Denies heartburn. Abdominal pain or weight loss  The remainder of the 10 point ROS is negative except as outlined in the H&P  Physical Exam: General Appearance Well developed, in no distress Eyes  Non icteric  HEENT  Non traumatic, normocephalic  Mouth No lesion, tongue papillated, no cheilosis Neck Supple without adenopathy, thyroid not enlarged, no carotid bruits, no JVD Lungs Clear to auscultation bilaterally COR Normal S1, normal S2, regular rhythm, no murmur, quiet precordium Abdomen soft nontender abdomen with normoactive bowel sounds Rectal not done Extremities  No  pedal edema Skin No lesions Neurological Alert and oriented x 3 Psychological Normal mood and affect  Assessment and Plan:   59 year old white male with the Barrett's esophagus and to associated symptoms which could be possibly related to Zegerid. We will discontinue PPI and start  ranitidine 150 mg in the morning. If it doesn't control the symptoms we will increase Zantac 150 mg twice a day. Episodes of dizziness and vertigo. 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 Normal hearing evaluation as well as the office visit with Dr. Lucia Gaskins. He may need a referral to a tertiary facility to examine his vestibular function. 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 I will find out from Dr Thornell Mule.      Delfin Edis 08/09/2014

## 2014-08-15 ENCOUNTER — Encounter: Payer: Self-pay | Admitting: Rehabilitative and Restorative Service Providers"

## 2014-08-15 NOTE — Therapy (Signed)
[facility] [address] [place], [place], [address] Phone: [phone]   Fax:  [phone]  Patient Details  Name: Wesley [name] MRN: [id number] Date of Birth: [birth year] Referring Provider:  No ref. provider found  Encounter Date: 08/15/2014  PHYSICAL THERAPY DISCHARGE SUMMARY  Visits from Start of Care: 3  Current functional level related to goals / functional outcomes:     PT Long Term Goals - 07/19/14 1425    PT LONG TERM GOAL #1   Title The patient will be indep with HEP for gaze adaptation and habituation.  Target date 08/01/2014.   Baseline met on 07/19/2014   Time 4   Period Weeks   Status Achieved   PT LONG TERM GOAL #2   Title The patient will decrease DHI by 8% (from 18% to < or equal to 10%) for subjective improvement in symptoms.  Target date 08/01/2014.   Baseline Not re-tested at this time.   Time 4   Period Weeks   Status Deferred        Remaining deficits: A general lightheadedness and fogginess noted initially in AM.  PT recommended patient further discuss with MD.   Education / Equipment: HEP.  Plan: Patient agrees to discharge.  Patient goals were met. Patient is being discharged due to meeting the stated rehab goals.  The patient is also seeking further work-up for medical issues that may be contributing to sensation of lightheadedness.   Thank you for the referral of this patient.     Khanh Cordner 08/15/2014, 9:13 AM  [facility] [address] [place], [place], [address] Phone: [phone]   Fax:  [phone]

## 2014-10-17 ENCOUNTER — Ambulatory Visit: Payer: Self-pay | Admitting: Neurology

## 2014-11-02 ENCOUNTER — Encounter: Payer: Self-pay | Admitting: Neurology

## 2014-11-02 ENCOUNTER — Ambulatory Visit (INDEPENDENT_AMBULATORY_CARE_PROVIDER_SITE_OTHER): Payer: BLUE CROSS/BLUE SHIELD | Admitting: Neurology

## 2014-11-02 ENCOUNTER — Encounter (INDEPENDENT_AMBULATORY_CARE_PROVIDER_SITE_OTHER): Payer: Self-pay

## 2014-11-02 VITALS — BP 110/66 | HR 60 | Wt 185.2 lb

## 2014-11-02 DIAGNOSIS — R42 Dizziness and giddiness: Secondary | ICD-10-CM | POA: Diagnosis not present

## 2014-11-02 NOTE — Patient Instructions (Signed)
I had a long discussion with the patient regarding his symptoms of intermittent subjective dizziness which appears to be of unclear etiology. I do not believe this is cerebrovascular in nature and he has also seen ENT physician recently. I advised him to keep his upcoming appointment with cardiologist for cardiac evaluation for his symptoms. I do not believe further neurological testing is indicated at the present time. He may return for follow-up in the future only as necessary.
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
## 2014-11-14 ENCOUNTER — Ambulatory Visit: Payer: 59 | Admitting: Neurology

## 2014-11-23 ENCOUNTER — Encounter: Payer: Self-pay | Admitting: Internal Medicine

## 2014-11-23 ENCOUNTER — Encounter (INDEPENDENT_AMBULATORY_CARE_PROVIDER_SITE_OTHER): Payer: BLUE CROSS/BLUE SHIELD

## 2014-11-23 ENCOUNTER — Ambulatory Visit (INDEPENDENT_AMBULATORY_CARE_PROVIDER_SITE_OTHER): Payer: BLUE CROSS/BLUE SHIELD | Admitting: Internal Medicine

## 2014-11-23 VITALS — BP 110/76 | HR 52 | Ht 72.0 in | Wt 179.3 lb

## 2014-11-23 DIAGNOSIS — I639 Cerebral infarction, unspecified: Secondary | ICD-10-CM | POA: Diagnosis not present

## 2014-11-23 DIAGNOSIS — R42 Dizziness and giddiness: Secondary | ICD-10-CM

## 2014-11-23 NOTE — Patient Instructions (Addendum)
Your physician has requested that you have an echocardiogram with bubble study. Echocardiography is a painless test that uses sound waves to create images of your heart. It provides your doctor with information about the size and shape of your heart and how well your heart's chambers and valves are working. This procedure takes approximately one hour. There are no restrictions for this procedure.  Your physician recommends that you schedule a follow-up appointment in 3-4 weeks after your tests - OK to double book as needed  Your Doctor has ordered you to wear a heart monitor. You will wear this for 14 days.   TIPS -  REMINDERS 1. The sensor is the lanyard that is worn around your neck every day - this is powered by a battery that needs to be changed every day 2. The monitor is the device that allows you to record symptoms - this will need to be charged daily 3. The sensor & monitor need to be within 100 feet of each other at all times 4. The sensor connects to the electrodes (stickers) - these should be changed every 24-48 hours (you do not have to remove them when you bathe, just make sure they are dry when you connect it back to the sensor 5. If you need more supplies (electrodes, batteries), please call the 1-800 # on the back of the pamphlet and [employer] will mail you more supplies 6. If your skin becomes sensitive, please try the sample pack of sensitive skin electrodes (the white packet in your silver box) and call [employer] to have them mail you more of these type of electrodes 7. When you are finish wearing the monitor, please place all supplies back in the silver box, place the silver box in the pre-packaged UPS bag and drop off at [employer] or call them so they can come pick it up   Cardiac Event Monitoring A cardiac event monitor is a small recording device used to help detect abnormal heart rhythms (arrhythmias). The monitor is used to record heart rhythm when noticeable symptoms such as the  following occur:  Fast heartbeats (palpitations), such as heart racing or fluttering.  Dizziness.  Fainting or light-headedness.  Unexplained weakness. The monitor is wired to two electrodes placed on your chest. Electrodes are flat, sticky disks that attach to your skin. The monitor can be worn for up to 30 days. You will wear the monitor at all times, except when bathing.  HOW TO USE YOUR CARDIAC EVENT MONITOR A technician will prepare your chest for the electrode placement. The technician will show you how to place the electrodes, how to work the monitor, and how to replace the batteries. Take time to practice using the monitor before you leave the office. Make sure you understand how to send the information from the monitor to your health care provider. This requires a telephone with a landline, not a cell phone. You need to:  Wear your monitor at all times, except when you are in water:  Do not get the monitor wet.  Take the monitor off when bathing. Do not swim or use a hot tub with it on.  Keep your skin clean. Do not put body lotion or moisturizer on your chest.  Change the electrodes daily or any time they stop sticking to your skin. You might need to use tape to keep them on.  It is possible that your skin under the electrodes could become irritated. To keep this from happening, try to put the  electrodes in slightly different places on your chest. However, they must remain in the area under your left breast and in the upper right section of your chest.  Make sure the monitor is safely clipped to your clothing or in a location close to your body that your health care provider recommends.  Press the button to record when you feel symptoms of heart trouble, such as dizziness, weakness, light-headedness, palpitations, thumping, shortness of breath, unexplained weakness, or a fluttering or racing heart. The monitor is always on and records what happened slightly before you pressed the  button, so do not worry about being too late to get good information.  Keep a diary of your activities, such as walking, doing chores, and taking medicine. It is especially important to note what you were doing when you pushed the button to record your symptoms. This will help your health care provider determine what might be contributing to your symptoms. The information stored in your monitor will be reviewed by your health care provider alongside your diary entries.  Send the recorded information as recommended by your health care provider. It is important to understand that it will take some time for your health care provider to process the results.  Change the batteries as recommended by your health care provider. SEEK IMMEDIATE MEDICAL CARE IF:   You have chest pain.  You have extreme difficulty breathing or shortness of breath.  You develop a very fast heartbeat that persists.  You develop dizziness that does not go away.  You faint or constantly feel you are about to faint. Document Released: 02/19/2008 Document Revised: 09/26/2013 Document Reviewed: 11/08/2012 [facility] Patient Information 2015 [place], [place]. This information is not intended to replace advice given to you by your health care provider. Make sure you discuss any questions you have with your health care provider.

## 2014-11-23 NOTE — Progress Notes (Signed)
OFFICE NOTE  Chief Complaint:  Dizziness, prior cerebellar stroke  Primary Care Physician: Laurey Morale, MD  HPI:  Wesley [name] is a pleasant 59 year old male who is coming referred to me for evaluation of dizziness and imbalance. He's also had episodes of vertigo. His past medical history is fairly nonsignificant without significant medical problems. He's fairly physically active and exercises regularly. 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 He said he was subjected to significant she forces which he wonders if that may play a role in his dizziness. This has been going on for some time but has recently gotten significantly worse. 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 He is seen in number specialists including Dr. Leonie Man with neurology. He tells me the past in [place] he had an MRI which showed 2 areas of infarction in the cerebellum. There is no clear evidence of what led to these episodes. He also reports some right arm discomfort from time to time including a shooting pain that goes down the arm. There is concern about possible cervical disc disease which would not be surprising is well given his work history. Is not clear whether this could be contributing to his dizziness. There is a concern for possible labyrinthitis although here nose and throat doctors have failed to demonstrate a clear link between the middle ear and his disequilibrium. He is concerned about possible arrhythmias or a cardiac cause of his symptoms. His brother recently was diagnosed with atrial fibrillation and his father does have a valvular heart disease. He denies any chest pain or shortness of breath and exercises regularly on a daily basis.  PMHx:  Past Medical History  Diagnosis Date  . Hx of colonic polyps   . Headache(784.0)   . Meningitis spinal   . Vertigo   . Stroke   . Ringing in ears   . Nervous   . Anxiety     Past Surgical  History  Procedure Laterality Date  . Colonoscopy  02/14/10    per Dr. Olevia Perches, polyps, repeat in 5 yrs  . Polypectomy    . Wisdom tooth extraction      FAMHx:  Family History  Problem Relation Age of Onset  . Arthritis      mat side  . Colon cancer Father     part of colon removed  . Cancer Father     non-Hodgkins lymphoma  . Arthritis Father   . Kidney disease Brother   . Prostate cancer Brother   . Rheumatic fever Maternal Grandfather   . Esophageal cancer Paternal Aunt   . Atrial fibrillation Brother   . Valvular heart disease Father     SOCHx:   reports that he has never smoked. He has never used smokeless tobacco. He reports that he drinks about 3.0 oz of alcohol per week. He reports that he does not use illicit drugs.  ALLERGIES:  No Known Allergies  ROS: A comprehensive review of systems was negative except for: Neurological: positive for coordination problems and dizziness  HOME MEDS: Current Outpatient Prescriptions  Medication Sig Dispense Refill  . aspirin 81 MG chewable tablet Chew 81 mg by mouth daily.    [name] b complex vitamins tablet Take 1 tablet by mouth daily.    . diazepam (VALIUM) 5 MG tablet 1/2 tab (2.5mg ) q8hrs as needed for vertigo 15 tablet 0  . NASONEX 50 MCG/ACT nasal spray     . Nutritional  Supplements (JUICE PLUS FIBRE PO) Take 2 capsules by mouth daily.    . ondansetron (ZOFRAN-ODT) 4 MG disintegrating tablet     . oxyCODONE-acetaminophen (PERCOCET/ROXICET) 5-325 MG per tablet Take 1-2 tablets by mouth every 4 (four) hours as needed for severe pain. 15 tablet 0  . PATANASE 0.6 % SOLN Place 1 each into the nose daily.     . ranitidine (ZANTAC) 150 MG tablet Take 1 tablet (150 mg total) by mouth daily. 90 tablet 3  . VIAGRA 100 MG tablet TAKE (1) TABLET AS NEEDED. 10 tablet 11   No current facility-administered medications for this visit.    LABS/IMAGING: No results found for this or any previous visit (from the past 48 hour(s)). No  results found.  WEIGHTS: Wt Readings from Last 3 Encounters:  11/23/14 179 lb 4.8 oz (81.33 kg)  11/02/14 185 lb 3.2 oz (84.006 kg)  08/09/14 187 lb (84.823 kg)    VITALS: BP 110/76 mmHg  Pulse 52  Ht 6' (1.829 m)  Wt 179 lb 4.8 oz (81.33 kg)  BMI 24.31 kg/m2  EXAM: General appearance: alert and no distress Neck: no carotid bruit and no JVD Lungs: clear to auscultation bilaterally Heart: regular rate and rhythm, S1, S2 normal, no murmur, click, rub or gallop Abdomen: soft, non-tender; bowel sounds normal; no masses,  no organomegaly Extremities: extremities normal, atraumatic, no cyanosis or edema Pulses: 2+ and symmetric Skin: Skin color, texture, turgor normal. No rashes or lesions Neurologic: Grossly normal Psych: Normal, pleasant  EKG: Sinus bradycardia 52  ASSESSMENT: 1. Dizziness and disequilibrium 2. Prior cerebellar stroke  PLAN: 1.   Mr. Gienger is having paroxysmal dizziness and disequilibrium. Neurologic workup has been unrevealing other than prior cerebellar stroke. No clear etiology of this is been noted. He's concerned about possible valvular heart disease although I do not detect any heart murmurs today. The other possibility of his prior cerebellar stroke is whether or not he may have a PFO or atrial septal defect. I would recommend an echocardiogram with bubble study to further evaluate this. In addition, will place a two-week monitor to see if he's having any occult arrhythmias. He understands that even if we demonstrate no evidence of atrial arrhythmia, that this does not exclude the possibility of an arrhythmic event. I don't believe at this point is clear evidence to recommend an implantable monitor.  Plan to see him back to discuss results of his monitor echocardiogram in a few weeks. Thanks for the kind referral.  Pixie Casino, MD, [facility] Attending Cardiologist [place] 11/23/2014, 12:36 PM

## 2014-11-24 ENCOUNTER — Encounter: Payer: Self-pay | Admitting: *Deleted

## 2014-11-29 ENCOUNTER — Ambulatory Visit (HOSPITAL_COMMUNITY)
Admission: RE | Admit: 2014-11-29 | Discharge: 2014-11-29 | Disposition: A | Discharge status: Home / Self Care | Payer: BLUE CROSS/BLUE SHIELD | Source: Ambulatory Visit | Attending: Cardiovascular Disease | Admitting: Cardiovascular Disease

## 2014-11-29 DIAGNOSIS — Z8673 Personal history of transient ischemic attack (TIA), and cerebral infarction without residual deficits: Secondary | ICD-10-CM | POA: Diagnosis not present

## 2014-11-29 DIAGNOSIS — R42 Dizziness and giddiness: Secondary | ICD-10-CM | POA: Insufficient documentation

## 2014-12-04 ENCOUNTER — Other Ambulatory Visit: Payer: Self-pay | Admitting: *Deleted

## 2014-12-04 ENCOUNTER — Telehealth: Payer: Self-pay | Admitting: Internal Medicine

## 2014-12-04 DIAGNOSIS — R5383 Other fatigue: Secondary | ICD-10-CM

## 2014-12-04 DIAGNOSIS — R931 Abnormal findings on diagnostic imaging of heart and coronary circulation: Secondary | ICD-10-CM

## 2014-12-04 DIAGNOSIS — R42 Dizziness and giddiness: Secondary | ICD-10-CM

## 2014-12-04 DIAGNOSIS — Z01812 Encounter for preprocedural laboratory examination: Secondary | ICD-10-CM

## 2014-12-04 DIAGNOSIS — Z79899 Other long term (current) drug therapy: Secondary | ICD-10-CM

## 2014-12-04 DIAGNOSIS — D689 Coagulation defect, unspecified: Secondary | ICD-10-CM

## 2014-12-04 NOTE — Telephone Encounter (Signed)
Jenna called pt. And left a message to call back, pt. Called back and Eliezer Lofts is talking ti him now with his results and is explaining his upcoming procedure's

## 2014-12-04 NOTE — Telephone Encounter (Signed)
Follow Up ° ° ° °Pt is calling to follow up on test results. Please call. °

## 2014-12-05 ENCOUNTER — Encounter: Payer: Self-pay | Admitting: Internal Medicine

## 2014-12-05 ENCOUNTER — Other Ambulatory Visit: Payer: Self-pay | Admitting: *Deleted

## 2014-12-05 DIAGNOSIS — R931 Abnormal findings on diagnostic imaging of heart and coronary circulation: Secondary | ICD-10-CM

## 2014-12-05 DIAGNOSIS — R42 Dizziness and giddiness: Secondary | ICD-10-CM

## 2014-12-06 LAB — CBC
HCT: 44.6 % (ref 39.0–52.0)
HEMOGLOBIN: 15.3 g/dL (ref 13.0–17.0)
MCH: 30.4 pg (ref 26.0–34.0)
MCHC: 34.3 g/dL (ref 30.0–36.0)
MCV: 88.5 fL (ref 78.0–100.0)
MPV: 9.6 fL (ref 8.6–12.4)
PLATELETS: 270 10*3/uL (ref 150–400)
RBC: 5.04 MIL/uL (ref 4.22–5.81)
RDW: 13.8 % (ref 11.5–15.5)
WBC: 7.4 10*3/uL (ref 4.0–10.5)

## 2014-12-06 LAB — BASIC METABOLIC PANEL
BUN: 17 mg/dL (ref 6–23)
CALCIUM: 9.5 mg/dL (ref 8.4–10.5)
CO2: 25 meq/L (ref 19–32)
CREATININE: 0.99 mg/dL (ref 0.50–1.35)
Chloride: 104 mEq/L (ref 96–112)
Glucose, Bld: 88 mg/dL (ref 70–99)
Potassium: 4.7 mEq/L (ref 3.5–5.3)
SODIUM: 141 meq/L (ref 135–145)

## 2014-12-06 LAB — TSH: TSH: 1.376 u[IU]/mL (ref 0.350–4.500)

## 2014-12-07 ENCOUNTER — Ambulatory Visit (HOSPITAL_COMMUNITY)
Admission: RE | Admit: 2014-12-07 | Discharge: 2014-12-07 | Disposition: A | Discharge status: Home / Self Care | Payer: BLUE CROSS/BLUE SHIELD | Source: Ambulatory Visit | Attending: Internal Medicine | Admitting: Internal Medicine

## 2014-12-07 ENCOUNTER — Telehealth: Payer: Self-pay | Admitting: Internal Medicine

## 2014-12-07 ENCOUNTER — Encounter (HOSPITAL_COMMUNITY): Payer: Self-pay

## 2014-12-07 ENCOUNTER — Encounter (HOSPITAL_COMMUNITY)
Admission: RE | Disposition: A | Discharge status: Home / Self Care | Payer: Self-pay | Source: Ambulatory Visit | Attending: Internal Medicine

## 2014-12-07 DIAGNOSIS — I253 Aneurysm of heart: Secondary | ICD-10-CM | POA: Diagnosis not present

## 2014-12-07 DIAGNOSIS — I081 Rheumatic disorders of both mitral and tricuspid valves: Secondary | ICD-10-CM | POA: Insufficient documentation

## 2014-12-07 DIAGNOSIS — R51 Headache: Secondary | ICD-10-CM

## 2014-12-07 DIAGNOSIS — R42 Dizziness and giddiness: Secondary | ICD-10-CM

## 2014-12-07 DIAGNOSIS — I639 Cerebral infarction, unspecified: Secondary | ICD-10-CM | POA: Diagnosis present

## 2014-12-07 DIAGNOSIS — R519 Headache, unspecified: Secondary | ICD-10-CM | POA: Diagnosis present

## 2014-12-07 DIAGNOSIS — R931 Abnormal findings on diagnostic imaging of heart and coronary circulation: Secondary | ICD-10-CM

## 2014-12-07 HISTORY — DX: Gastro-esophageal reflux disease without esophagitis: K21.9

## 2014-12-07 HISTORY — PX: TEE WITHOUT CARDIOVERSION: SHX5443

## 2014-12-07 LAB — PROTIME-INR
INR: 1.11 (ref <1.50)
Prothrombin Time: 14.3 seconds (ref 11.6–15.2)

## 2014-12-07 LAB — APTT: APTT: 30 s (ref 24–37)

## 2014-12-07 SURGERY — ECHOCARDIOGRAM, TRANSESOPHAGEAL
Anesthesia: Moderate Sedation

## 2014-12-07 MED ORDER — LIDOCAINE VISCOUS 2 % MT SOLN
OROMUCOSAL | Status: DC | PRN
  Administered 2014-12-07: 1 via OROMUCOSAL

## 2014-12-07 MED ORDER — MIDAZOLAM HCL 5 MG/ML IJ SOLN
INTRAMUSCULAR | Status: AC
  Filled 2014-12-07: qty 2

## 2014-12-07 MED ORDER — LIDOCAINE VISCOUS 2 % MT SOLN
OROMUCOSAL | Status: AC
  Filled 2014-12-07: qty 15

## 2014-12-07 MED ORDER — SODIUM CHLORIDE 0.9 % IV SOLN: INTRAVENOUS | Status: DC

## 2014-12-07 MED ORDER — BUTAMBEN-TETRACAINE-BENZOCAINE 2-2-14 % EX AERO
INHALATION_SPRAY | CUTANEOUS | Status: DC | PRN
  Administered 2014-12-07: 2 via TOPICAL

## 2014-12-07 MED ORDER — MIDAZOLAM HCL 10 MG/2ML IJ SOLN
INTRAMUSCULAR | Status: DC | PRN
  Administered 2014-12-07: 2 mg via INTRAVENOUS
  Administered 2014-12-07 (×2): 1 mg via INTRAVENOUS
  Administered 2014-12-07: 2 mg via INTRAVENOUS

## 2014-12-07 MED ORDER — FENTANYL CITRATE (PF) 100 MCG/2ML IJ SOLN
INTRAMUSCULAR | Status: DC | PRN
  Administered 2014-12-07: 25 ug via INTRAVENOUS
  Administered 2014-12-07: 50 ug via INTRAVENOUS
  Administered 2014-12-07: 25 ug via INTRAVENOUS

## 2014-12-07 MED ORDER — FENTANYL CITRATE (PF) 100 MCG/2ML IJ SOLN
INTRAMUSCULAR | Status: AC
  Filled 2014-12-07: qty 2

## 2014-12-07 NOTE — Progress Notes (Signed)
*  PRELIMINARY RESULTS* Echocardiogram TEE has been performed.  Leavy Cella 12/07/2014, 11:06 AM

## 2014-12-07 NOTE — Telephone Encounter (Signed)
LEFT MESSAGE ON PATIENT'S VOICE MAILTO CALL BACK.

## 2014-12-07 NOTE — CV Procedure (Signed)
    TRANSESOPHAGEAL ECHOCARDIOGRAM (TEE) NOTE  INDICATIONS: stroke, ?PFO  PROCEDURE:   Informed consent was obtained prior to the procedure. The risks, benefits and alternatives for the procedure were discussed and the patient comprehended these risks.  Risks include, but are not limited to, cough, sore throat, vomiting, nausea, somnolence, esophageal and stomach trauma or perforation, bleeding, low blood pressure, aspiration, pneumonia, infection, trauma to the teeth and death.    After a procedural time-out, the patient was given 6 mg versed and 100 mcg fentanyl for moderate sedation.  The oropharynx was anesthetized 10 cc of topical 1% viscous lidocaine and 2 cetacaine sprays.  The transesophageal probe was inserted in the esophagus and stomach without difficulty and multiple views were obtained.  The patient was kept under observation until the patient left the procedure room.  The patient left the procedure room in stable condition.   Agitated microbubble saline contrast was administered.  COMPLICATIONS:    There were no immediate complications.  Findings:  1. LEFT VENTRICLE: The left ventricular wall thickness is normal.  The left ventricular cavity is normal in size. Wall motion is normal.  LVEF is 55-60%.  2. RIGHT VENTRICLE:  The right ventricle is normal in structure and function without any thrombus or masses.    3. LEFT ATRIUM:  The left atrium is normal in size without any thrombus or masses.  There is not spontaneous echo contrast ("smoke") in the left atrium consistent with a low flow state.  4. LEFT ATRIAL APPENDAGE:  The left atrial appendage is free of any thrombus or masses. The appendage has single lobes. Pulse doppler indicates high flow in the appendage.  5. ATRIAL SEPTUM:  The atrial septum is aneurysmal and is free of thrombus and/or masses.  There is no evidence for interatrial shunting by color doppler and saline microbubble.  6. RIGHT ATRIUM:  The right  atrium is normal in size and function without any thrombus or masses. There is a prominent eustachian valve.  7. MITRAL VALVE:  The mitral valve is normal in structure and function with trivial regurgitation.  There were no vegetations or stenosis.  8. AORTIC VALVE:  The aortic valve is trileaflet, normal in structure and function with no regurgitation.  There were no vegetations or stenosis  9. TRICUSPID VALVE:  The tricuspid valve is normal in structure and function with trivial regurgitation.  There were no vegetations or stenosis  10.  PULMONIC VALVE:  The pulmonic valve is normal in structure and function with no regurgitation.  There were no vegetations or stenosis.   11. AORTIC ARCH, ASCENDING AND DESCENDING AORTA:  There was no atherosclerosis of the ascending aorta, aortic arch, or proximal descending aorta.  12. PULMONARY VEINS: Anomalous pulmonary venous return was not noted.  13. PERICARDIUM: The pericardium appeared normal and non-thickened.  There is no pericardial effusion.  IMPRESSION:   1. No LAA thrombus 2. 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 Prominent eustachian valve 4. No evidence for anomalous pulmonary venous return 5. No significant valvular disease 6. Normal LV function  RECOMMENDATIONS:    1. No evidence for intracardiac source of embolus. 2. Negative for PFO or intra-atrial shunting  Time Spent Directly with the Patient:  30 minutes   Pixie Casino, MD, [facility] Attending Cardiologist [facility]  12/07/2014, 9:44 AM

## 2014-12-07 NOTE — H&P (Signed)
    INTERVAL PROCEDURE H&P  History and Physical Interval Note:  12/07/2014 8:43 AM  Wesley [name] has presented today for their planned procedure. The various methods of treatment have been discussed with the patient and family. After consideration of risks, benefits and other options for treatment, the patient has consented to the procedure.  The patients' outpatient history has been reviewed, patient examined, and no change in status from most recent office note within the past 30 days. I have reviewed the patients' chart and labs and will proceed as planned. Questions were answered to the patient's satisfaction.   Pixie Casino, MD, [facility] Attending Cardiologist [place] 12/07/2014, 8:43 AM

## 2014-12-07 NOTE — Telephone Encounter (Signed)
Pt called in stating that he had a procedure done yesterday by Dr. Debara Pickett and he says that he was to drowsy to understand what Dr. Debara Pickett was telling him in reference to post procedure care. He would like to speak to Dr.Hilty or his nurse if possible about this. Please call  Thanks

## 2014-12-07 NOTE — Discharge Instructions (Signed)
Transesophageal Echocardiogram °Transesophageal echocardiography (TEE) is a picture test of your heart using sound waves. The pictures taken can give very detailed pictures of your heart. This can help your doctor see if there are problems with your heart. TEE can check: °· If your heart has blood clots in it. °· How well your heart valves are working. °· If you have an infection on the inside of your heart. °· Some of the major arteries of your heart. °· If your heart valve is working after a repair. °· Your heart before a procedure that uses a shock to your heart to get the rhythm back to normal. °BEFORE THE PROCEDURE °· Do not eat or drink for 6 hours before the procedure or as told by your doctor. °· Make plans to have someone drive you home after the procedure. Do not drive yourself home. °· An IV tube will be put in your arm. °PROCEDURE °· You will be given a medicine to help you relax (sedative). It will be given through the IV tube. °· A numbing medicine will be sprayed or gargled in the back of your throat to help numb it. °· The tip of the probe is placed into the back of your mouth. You will be asked to swallow. This helps to pass the probe into your esophagus. °· Once the tip of the probe is in the right place, your doctor can take pictures of your heart. °· You may feel pressure at the back of your throat. °AFTER THE PROCEDURE °· You will be taken to a recovery area so the sedative can wear off. °· Your throat may be sore and scratchy. This will go away slowly over time. °· You will go home when you are fully awake and able to swallow liquids. °· You should have someone stay with you for the next 24 hours. °· Do not drive or operate machinery for the next 24 hours. °Document Released: 03/09/2009 Document Revised: 05/17/2013 Document Reviewed: 11/11/2012 °[employer]® Patient Information ©2015 [employer]. This information is not intended to replace advice given to you by your health care provider. Make  sure you discuss any questions you have with your health care provider. ° °

## 2014-12-07 NOTE — Telephone Encounter (Signed)
Informed patient   unable to give any information- not available in computer Patient aware will route to Dr Debara Pickett

## 2014-12-07 NOTE — Telephone Encounter (Signed)
Procedure was done today - medication has caused him to forget what we discussed. I do not anticipate any complications. He should have post-procedure instructions in his discharge paperwork.  The TEE was negative - no PFO found in the heart. If he gets a sore throat, it should last less than 1 day and can use a salt water gargle to help with symptoms.  Dr. Lemmie Evens

## 2014-12-08 ENCOUNTER — Encounter (HOSPITAL_COMMUNITY): Payer: Self-pay | Admitting: Internal Medicine

## 2014-12-08 ENCOUNTER — Telehealth: Payer: Self-pay | Admitting: Internal Medicine

## 2014-12-08 NOTE — Telephone Encounter (Signed)
Spoke to patient RN gave information from Dr Debara Pickett. Patient had more questions and they will be discuss at office appointment 01/02/15 at 9:45. patient voiced understanding.

## 2014-12-08 NOTE — Telephone Encounter (Signed)
Returning call, no answer ,left message to call back.

## 2014-12-08 NOTE — Telephone Encounter (Signed)
NO ANSWER   SEE OTHER NOTE

## 2014-12-08 NOTE — Telephone Encounter (Signed)
Pt had TEE yesterday.  Wants to know about results, etc.

## 2014-12-08 NOTE — Telephone Encounter (Signed)
Patient is returning our call regarding TEE results.

## 2014-12-12 ENCOUNTER — Telehealth: Payer: Self-pay | Admitting: *Deleted

## 2014-12-12 NOTE — Telephone Encounter (Signed)
Spoke with patient and communicated MD advice, which was reported to patient last week as well. He expressed concerns over "floating" tissue between the chambers and I informed him that Dr. Debara Pickett did not express a correlation between this "floating" tissue and his symptoms. He thanked Therapist, sports for response and was informed if further questions arise, we can address at [place] 8/9

## 2014-12-12 NOTE — Telephone Encounter (Signed)
I already answered this question Ivin Booty sent me a notification. TEE was negative for PFO. Nothing else to report.  Dr. Lemmie Evens

## 2014-12-12 NOTE — Telephone Encounter (Signed)
Patient called in with questions regarding his TEE. Of note, he has spoken with Ivin Booty, RN regarding this last week and was advised that the procedure will be reviewed at his appointment 8/9.  He states he was foggy from the medications and could not recall what was told to him about the TEE results. He states his son told him that the "tissue between the chambers was 'floating' and not solid" and wanted to know if this could be the cause of his symptoms.   He was informed of monitor results and asked if the monitor showed any indication of sleep apnea. Informed patient that the monitor only showed heart rate, rhythm and his record symptoms and that sleep study is what would be necessary to determine sleep apnea. He voiced understanding.   Patient was notified MD is out of office until Friday, as he requested to speak with Dr. Debara Pickett directly, and he was informed that this message will be forwarded to Dr. Debara Pickett to review

## 2015-01-02 ENCOUNTER — Ambulatory Visit: Payer: BLUE CROSS/BLUE SHIELD | Admitting: Internal Medicine

## 2015-01-03 ENCOUNTER — Ambulatory Visit: Payer: BLUE CROSS/BLUE SHIELD | Admitting: Neurology

## 2015-01-23 DIAGNOSIS — Z0271 Encounter for disability determination: Secondary | ICD-10-CM

## 2015-02-21 ENCOUNTER — Encounter: Payer: Self-pay | Admitting: Internal Medicine

## 2015-02-21 ENCOUNTER — Ambulatory Visit (INDEPENDENT_AMBULATORY_CARE_PROVIDER_SITE_OTHER): Payer: BLUE CROSS/BLUE SHIELD | Admitting: Internal Medicine

## 2015-02-21 VITALS — BP 116/68 | HR 52 | Ht 72.0 in | Wt 184.4 lb

## 2015-02-21 DIAGNOSIS — R42 Dizziness and giddiness: Secondary | ICD-10-CM | POA: Diagnosis not present

## 2015-02-21 DIAGNOSIS — I639 Cerebral infarction, unspecified: Secondary | ICD-10-CM | POA: Diagnosis not present

## 2015-02-21 NOTE — Patient Instructions (Signed)
Your physician recommends that you schedule a follow-up appointment as needed  

## 2015-02-21 NOTE — Progress Notes (Signed)
OFFICE NOTE  Chief Complaint:  Dizziness, prior cerebellar stroke  Primary Care Physician: Laurey Morale, MD  HPI:  Wesley [name] is a pleasant 59 year old male who is coming referred to me for evaluation of dizziness and imbalance. He's also had episodes of vertigo. His past medical history is fairly nonsignificant without significant medical problems. He's fairly physically active and exercises regularly. 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 He said he was subjected to significant she forces which he wonders if that may play a role in his dizziness. This has been going on for some time but has recently gotten significantly worse. 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 He is seen in number specialists including Dr. Leonie Man with neurology. He tells me the past in [place] he had an MRI which showed 2 areas of infarction in the cerebellum. There is no clear evidence of what led to these episodes. He also reports some right arm discomfort from time to time including a shooting pain that goes down the arm. There is concern about possible cervical disc disease which would not be surprising is well given his work history. Is not clear whether this could be contributing to his dizziness. There is a concern for possible labyrinthitis although here nose and throat doctors have failed to demonstrate a clear link between the middle ear and his disequilibrium. He is concerned about possible arrhythmias or a cardiac cause of his symptoms. His brother recently was diagnosed with atrial fibrillation and his father does have a valvular heart disease. He denies any chest pain or shortness of breath and exercises regularly on a daily basis.  Wesley [name] Was Seen Back in the Office Today in Follow-Up. He reported this morning that he developed some dizziness again. 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 This was also proposed by his neurologist. Workup from a cardiac standpoint appears to be negative. His TEE demonstrated no evidence of inducible PFO as was suggested by a 2-D echocardiogram. His Holter monitor demonstrated in frequent PVCs which did not seem to be associated with his dizziness.  PMHx:  Past Medical History  Diagnosis Date  . Hx of colonic polyps   . Headache(784.0)   . Meningitis spinal   . Vertigo   . Ringing in ears   . Nervous   . Anxiety   . GERD (gastroesophageal reflux disease)     Past Surgical History  Procedure Laterality Date  . Colonoscopy  02/14/10    per Dr. Olevia Perches, polyps, repeat in 5 yrs  . Polypectomy    . Wisdom tooth extraction    . Tee without cardioversion N/A 12/07/2014    Procedure: TRANSESOPHAGEAL ECHOCARDIOGRAM (TEE);  Surgeon: Pixie Casino, MD;  Location: [facility] ENDOSCOPY;  Service: Cardiovascular;  Laterality: N/A;    FAMHx:  Family History  Problem Relation Age of Onset  . Arthritis      mat side  . Colon cancer Father     part of colon removed  . Cancer Father     non-Hodgkins lymphoma  . Arthritis Father   . Kidney disease Brother     transplant  . Prostate cancer Brother   . Rheumatic fever Maternal Grandfather     heart disease  . Esophageal cancer Paternal Aunt   . Atrial fibrillation Brother   . Valvular heart disease Father     SOCHx:  reports that he has never smoked. He has never used smokeless tobacco. He reports that he drinks about 3.0 oz of alcohol per week. He reports that he does not use illicit drugs.  ALLERGIES:  No Known Allergies  ROS: A comprehensive review of systems was negative except for: Neurological: positive for coordination problems and dizziness  HOME MEDS: Current Outpatient Prescriptions  Medication Sig Dispense Refill  . aspirin 81 MG chewable tablet Chew 81 mg by mouth daily.    [name] b complex vitamins tablet Take 1 tablet by mouth daily.    . diazepam (VALIUM) 5 MG tablet  1/2 tab (2.5mg ) q8hrs as needed for vertigo 15 tablet 0  . NASONEX 50 MCG/ACT nasal spray     . Nutritional Supplements (JUICE PLUS FIBRE PO) Take 2 capsules by mouth daily.    . ondansetron (ZOFRAN-ODT) 4 MG disintegrating tablet     . oxyCODONE-acetaminophen (PERCOCET/ROXICET) 5-325 MG per tablet Take 1-2 tablets by mouth every 4 (four) hours as needed for severe pain. 15 tablet 0  . PATANASE 0.6 % SOLN Place 1 each into the nose daily.     . ranitidine (ZANTAC) 150 MG tablet Take 1 tablet (150 mg total) by mouth daily. 90 tablet 3  . VIAGRA 100 MG tablet TAKE (1) TABLET AS NEEDED. 10 tablet 11   No current facility-administered medications for this visit.    LABS/IMAGING: No results found for this or any previous visit (from the past 48 hour(s)). No results found.  WEIGHTS: Wt Readings from Last 3 Encounters:  02/21/15 184 lb 6.4 oz (83.643 kg)  12/07/14 180 lb (81.647 kg)  11/23/14 179 lb 4.8 oz (81.33 kg)    VITALS: BP 116/68 mmHg  Pulse 52  Ht 6' (1.829 m)  Wt 184 lb 6.4 oz (83.643 kg)  BMI 25.00 kg/m2  EXAM: Deferred  EKG: Deferred  ASSESSMENT: 1. 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 Prior cerebellar stroke  PLAN: 1.   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 He has seen a neurologist and an ENT who both agree that this is a likely cause. He has been hesitant to try medication for this however I think that he should at least give medication a try. From a cardiac standpoint I cannot find an obvious cardiovascular cause of his symptoms. TEE was definitively negative for PFO. He did have some occasional PVCs on his monitor, but these are benign and do not seem to be related to his dizziness temporally.  I'm happy to see Wesley [name] back on an as-needed basis.  Pixie Casino, MD, [facility] Attending Cardiologist [place] 02/21/2015, 12:41 PM

## 2015-03-06 ENCOUNTER — Other Ambulatory Visit: Payer: Self-pay | Admitting: Family Medicine

## 2015-03-30 ENCOUNTER — Ambulatory Visit: Payer: BLUE CROSS/BLUE SHIELD | Admitting: Neurology

## 2015-05-30 ENCOUNTER — Ambulatory Visit (AMBULATORY_SURGERY_CENTER): Payer: Self-pay | Admitting: *Deleted

## 2015-05-30 VITALS — Ht 72.0 in | Wt 188.0 lb

## 2015-05-30 DIAGNOSIS — Z800 Family history of malignant neoplasm of digestive organs: Secondary | ICD-10-CM

## 2015-05-30 DIAGNOSIS — K2270 Barrett's esophagus without dysplasia: Secondary | ICD-10-CM

## 2015-05-30 DIAGNOSIS — Z86010 Personal history of colonic polyps: Secondary | ICD-10-CM

## 2015-05-30 NOTE — Progress Notes (Signed)
No egg or soy allergy known to patient  No issues with past sedation with any surgeries  or procedures, no intubation problems  No diet pills per patient  No home 02 use per patient   emmi declined

## 2015-06-13 ENCOUNTER — Ambulatory Visit (AMBULATORY_SURGERY_CENTER): Payer: BLUE CROSS/BLUE SHIELD | Admitting: Gastroenterology

## 2015-06-13 ENCOUNTER — Encounter: Payer: Self-pay | Admitting: Gastroenterology

## 2015-06-13 VITALS — BP 94/66 | HR 53 | Temp 96.5°F | Resp 13 | Ht 72.0 in | Wt 188.0 lb

## 2015-06-13 DIAGNOSIS — K2270 Barrett's esophagus without dysplasia: Secondary | ICD-10-CM | POA: Diagnosis not present

## 2015-06-13 DIAGNOSIS — Z800 Family history of malignant neoplasm of digestive organs: Secondary | ICD-10-CM | POA: Diagnosis present

## 2015-06-13 DIAGNOSIS — D120 Benign neoplasm of cecum: Secondary | ICD-10-CM

## 2015-06-13 DIAGNOSIS — D123 Benign neoplasm of transverse colon: Secondary | ICD-10-CM

## 2015-06-13 DIAGNOSIS — Z86010 Personal history of colonic polyps: Secondary | ICD-10-CM | POA: Diagnosis not present

## 2015-06-13 HISTORY — PX: COLONOSCOPY: SHX174

## 2015-06-13 HISTORY — PX: ESOPHAGOGASTRODUODENOSCOPY: SHX1529

## 2015-06-13 MED ORDER — SODIUM CHLORIDE 0.9 % IV SOLN: 500.0000 mL | INTRAVENOUS | Status: DC

## 2015-06-13 NOTE — Op Note (Signed)
[place]  [address] [place], [address]   COLONOSCOPY PROCEDURE REPORT  PATIENT: Wesley [name], Wesley [name]  MR#: [id number] BIRTHDATE: 1955/09/05 , 59  yrs. old GENDER: male ENDOSCOPIST: Yetta Flock, MD REFERRED BY: PROCEDURE DATE:  06/13/2015 PROCEDURE:   Colonoscopy, surveillance , Colonoscopy with snare polypectomy, and Colonoscopy with biopsy First Screening Colonoscopy - Avg.  risk and is 50 yrs.  old or older - No.  Prior Negative Screening - Now for repeat screening. N/A  History of Adenoma - Now for follow-up colonoscopy & has been > or = to 3 yrs.  Yes hx of adenoma.  Has been 3 or more years since last colonoscopy.  Polyps removed today? Yes ASA CLASS:   Class II INDICATIONS:Surveillance due to prior colonic neoplasia and FH Colon or Rectal Adenocarcinoma (father age 8s). MEDICATIONS: Propofol 200 mg IV  DESCRIPTION OF PROCEDURE:   After the risks benefits and alternatives of the procedure were thoroughly explained, informed consent was obtained.  The digital rectal exam revealed no abnormalities of the rectum.   The LB [id number]  endoscope was introduced through the anus and advanced to the cecum, which was identified by both the appendix and ileocecal valve. No adverse events experienced.   The quality of the prep was adequate  The instrument was then slowly withdrawn as the colon was fully examined. Estimated blood loss is zero unless otherwise noted in this procedure report.   COLON FINDINGS: The colon was tortous.  A 59mm sessile polyp was noted along a fold in the cecum and removed with cold snare.  Two diminutive sessile polyps were noted in the transverse colon and removed with cold forceps.  Moderate diverticulosis was noted in the left colon.  The remainder of the colon was normal. Retroflexed views revealed no abnormalities. The time to cecum = 5.8 Withdrawal time = 18.2   The scope was withdrawn and the procedure  completed. COMPLICATIONS: There were no immediate complications.  ENDOSCOPIC IMPRESSION: Tortous colon 3 small polyps removed as outlined above Moderate left sided diverticulosis  RECOMMENDATIONS: No NSAIDs for 2 weeks Await pathology results Resume diet Resume medications  eSigned:  Yetta Flock, MD 06/13/2015 8:47 AM   cc: the patient

## 2015-06-13 NOTE — Op Note (Signed)
[place]  [address]. [place], [address]   ENDOSCOPY PROCEDURE REPORT  PATIENT: Wesley, [name]  MR#: [id number] BIRTHDATE: 23-Dec-1955 , 59  yrs. old GENDER: male ENDOSCOPIST: Yetta Flock, MD REFERRED BY: PROCEDURE DATE:  06/13/2015 PROCEDURE:  EGD w/ biopsy ASA CLASS:     Class II INDICATIONS:  follow up of Barrett's esophagus, short segment. Last EGD 2014. MEDICATIONS: Propofol 300 mg IV TOPICAL ANESTHETIC:  DESCRIPTION OF PROCEDURE: After the risks benefits and alternatives of the procedure were thoroughly explained, informed consent was obtained.  The LB [id number] endoscope was introduced through the mouth and advanced to the second portion of the duodenum , Without limitations.  The instrument was slowly withdrawn as the mucosa was fully examined.   FINDINGS: The esophagus was normal.  The SCJ was slightly irregular with a short (perhaps 54mm) extension of salmon colored mucosa above the GEJ circumferentially.  No nodularity was noted.  Biopsies obtained to ensure no dysplastic changes.  DH noted at 43cm from the incisors, GEJ and SCJ located at 42cm from the incisors, with a 1 cm hiatal hernia.The stomach was remarkable for what was suspected to be a prominent fold vs.  polypoid lesion noted on retroflexion in the gastric cardia.  Biopsies obtained.  The remainder of the examined stomach was normal.  The duodenal bulb and 2nd portion of the duodenum was normal.  Retroflexed views revealed as previously described.     The scope was then withdrawn from the patient and the procedure completed.  COMPLICATIONS: There were no immediate complications.  ENDOSCOPIC IMPRESSION: Slightly irregular SCJ - noted to have Barrett's on prior exam, biopsies taken to ensure no dysplastic changes 1cm hiatal hernia Suspected prominent fold vs. polypoid lesion in the gastric cardia, biopsies obtained Normal remainder of examined stomach and  duodenum  RECOMMENDATIONS: Await pathology results Resume diet Resume medications  eSigned:  Yetta Flock, MD 06/13/2015 8:53 AM    CC: the patient

## 2015-06-13 NOTE — Progress Notes (Signed)
Called to room to assist during endoscopic procedure.  Patient ID and intended procedure confirmed with present staff. Received instructions for my participation in the procedure from the performing physician.  

## 2015-06-13 NOTE — Patient Instructions (Signed)

## 2015-06-13 NOTE — Progress Notes (Signed)
A/ox3, pleased with MAC, report to RN 

## 2015-06-14 ENCOUNTER — Telehealth: Payer: Self-pay | Admitting: *Deleted

## 2015-06-14 NOTE — Telephone Encounter (Signed)
  Follow up Call-  Call back number 06/13/2015 04/28/2013  Post procedure Call Back phone  # [phone]  Permission to leave phone message Yes Yes     Patient questions:  Do you have a fever, pain , or abdominal swelling? No. Pain Score  0 *  Have you tolerated food without any problems? Yes.    Have you been able to return to your normal activities? Yes.    Do you have any questions about your discharge instructions: Diet   No. Medications  No. Follow up visit  No.  Do you have questions or concerns about your Care? No.  Actions: * If pain score is 4 or above: No action needed, pain <4.

## 2015-06-20 ENCOUNTER — Encounter: Payer: Self-pay | Admitting: Gastroenterology

## 2015-06-28 ENCOUNTER — Telehealth: Payer: Self-pay | Admitting: Gastroenterology

## 2015-06-28 NOTE — Telephone Encounter (Signed)
Reviewed results with patient and recommendations.

## 2015-07-13 ENCOUNTER — Other Ambulatory Visit (INDEPENDENT_AMBULATORY_CARE_PROVIDER_SITE_OTHER): Payer: BLUE CROSS/BLUE SHIELD

## 2015-07-13 DIAGNOSIS — Z0000 Encounter for general adult medical examination without abnormal findings: Secondary | ICD-10-CM | POA: Diagnosis not present

## 2015-07-13 LAB — POC URINALSYSI DIPSTICK (AUTOMATED)
BILIRUBIN UA: NEGATIVE
Blood, UA: NEGATIVE
GLUCOSE UA: NEGATIVE
Ketones, UA: NEGATIVE
LEUKOCYTES UA: NEGATIVE
NITRITE UA: NEGATIVE
PROTEIN UA: NEGATIVE
Spec Grav, UA: 1.02
Urobilinogen, UA: 0.2
pH, UA: 6

## 2015-07-13 LAB — LIPID PANEL
Cholesterol: 176 mg/dL (ref 0–200)
HDL: 63.2 mg/dL (ref >39.00)
LDL Cholesterol: 99 mg/dL (ref 0–99)
NONHDL: 113.05
Total CHOL/HDL Ratio: 3
Triglycerides: 69 mg/dL (ref 0.0–149.0)
VLDL: 13.8 mg/dL (ref 0.0–40.0)

## 2015-07-13 LAB — HEPATIC FUNCTION PANEL
ALT: 28 U/L (ref 0–53)
AST: 23 U/L (ref 0–37)
Albumin: 4.1 g/dL (ref 3.5–5.2)
Alkaline Phosphatase: 45 U/L (ref 39–117)
BILIRUBIN DIRECT: 0.1 mg/dL (ref 0.0–0.3)
BILIRUBIN TOTAL: 0.5 mg/dL (ref 0.2–1.2)
TOTAL PROTEIN: 6.5 g/dL (ref 6.0–8.3)

## 2015-07-13 LAB — CBC WITH DIFFERENTIAL/PLATELET
BASOS PCT: 0.8 % (ref 0.0–3.0)
Basophils Absolute: 0.1 10*3/uL (ref 0.0–0.1)
EOS ABS: 0.7 10*3/uL (ref 0.0–0.7)
Eosinophils Relative: 9 % — ABNORMAL HIGH (ref 0.0–5.0)
HCT: 45 % (ref 39.0–52.0)
Hemoglobin: 15.3 g/dL (ref 13.0–17.0)
LYMPHS ABS: 2.7 10*3/uL (ref 0.7–4.0)
Lymphocytes Relative: 34.5 % (ref 12.0–46.0)
MCHC: 33.9 g/dL (ref 30.0–36.0)
MCV: 88.7 fl (ref 78.0–100.0)
MONO ABS: 0.7 10*3/uL (ref 0.1–1.0)
Monocytes Relative: 8.5 % (ref 3.0–12.0)
NEUTROS ABS: 3.7 10*3/uL (ref 1.4–7.7)
Neutrophils Relative %: 47.2 % (ref 43.0–77.0)
PLATELETS: 317 10*3/uL (ref 150.0–400.0)
RBC: 5.07 Mil/uL (ref 4.22–5.81)
RDW: 14.2 % (ref 11.5–15.5)
WBC: 7.9 10*3/uL (ref 4.0–10.5)

## 2015-07-13 LAB — BASIC METABOLIC PANEL
BUN: 22 mg/dL (ref 6–23)
CALCIUM: 9.2 mg/dL (ref 8.4–10.5)
CO2: 30 meq/L (ref 19–32)
CREATININE: 1.07 mg/dL (ref 0.40–1.50)
Chloride: 104 mEq/L (ref 96–112)
GFR: 75.57 mL/min (ref >60.00)
GLUCOSE: 97 mg/dL (ref 70–99)
Potassium: 4.2 mEq/L (ref 3.5–5.1)
Sodium: 140 mEq/L (ref 135–145)

## 2015-07-13 LAB — TSH: TSH: 1.35 u[IU]/mL (ref 0.35–4.50)

## 2015-07-13 LAB — PSA: PSA: 2.74 ng/mL (ref 0.10–4.00)

## 2015-08-01 ENCOUNTER — Encounter: Payer: BLUE CROSS/BLUE SHIELD | Admitting: Family Medicine

## 2015-08-03 ENCOUNTER — Telehealth: Payer: Self-pay | Admitting: Family Medicine

## 2015-08-03 NOTE — Telephone Encounter (Signed)
lmom for pt to call back

## 2015-08-03 NOTE — Telephone Encounter (Signed)
Pt has a cpx sch for 08-15-15. Pt must go out of town and would like to have cpx on 08-14-15. Can I create 30 min slot?

## 2015-08-03 NOTE — Telephone Encounter (Signed)
Ok, per Dr. Sarajane Jews.

## 2015-08-06 NOTE — Telephone Encounter (Signed)
Pt has been sch

## 2015-08-14 ENCOUNTER — Encounter: Payer: Self-pay | Admitting: Family Medicine

## 2015-08-14 ENCOUNTER — Ambulatory Visit (INDEPENDENT_AMBULATORY_CARE_PROVIDER_SITE_OTHER): Payer: BLUE CROSS/BLUE SHIELD | Admitting: Family Medicine

## 2015-08-14 VITALS — BP 118/80 | HR 65 | Temp 98.4°F | Ht 71.0 in | Wt 191.0 lb

## 2015-08-14 DIAGNOSIS — R42 Dizziness and giddiness: Secondary | ICD-10-CM | POA: Diagnosis not present

## 2015-08-14 DIAGNOSIS — Z0001 Encounter for general adult medical examination with abnormal findings: Secondary | ICD-10-CM

## 2015-08-14 DIAGNOSIS — Z0000 Encounter for general adult medical examination without abnormal findings: Secondary | ICD-10-CM

## 2015-08-14 NOTE — Progress Notes (Signed)
   Subjective:    Patient ID: Wesley [name], male    DOB: Feb 15, 1956, 60 y.o.   MRN: [id number]  HPI 60 yr old male for a cpx and to discuss his ongoing dizzy spells. He feels fine in general other than the dizzy spells. These occur every few days and they can be mild or more severe. He feels at times lightheaded and at times like the room is spinning around him. He has been unable to work at all and is on disability now. 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 He has seen Dr. Leonie Man (Neurology) and Dr. Debara Pickett (Cardiology) over the past year, and they both agree that he should not be working. In June and July of 2016 he wore a heart monitor and had a transesophageal echocardiogram, and both of these studies were unremarkable. Around this same time he had several brain MRIs and these were normal except for a few small CSF cysts that were not felt to be clinically important. He eats a healthy diet and exercises.    Review of Systems  Constitutional: Negative.   HENT: Negative.   Eyes: Negative.   Respiratory: Negative.   Cardiovascular: Negative.   Gastrointestinal: Negative.   Genitourinary: Negative.   Musculoskeletal: Negative.   Skin: Negative.   Neurological: Positive for dizziness and light-headedness. Negative for tremors, seizures, syncope, facial asymmetry, speech difficulty, weakness, numbness and headaches.  Psychiatric/Behavioral: Negative.        Objective:   Physical Exam  Constitutional: He is oriented to person, place, and time. He appears well-developed and well-nourished. No distress.  HENT:  Head: Normocephalic and atraumatic.  Right Ear: External ear normal.  Left Ear: External ear normal.  Nose: Nose normal.  Mouth/Throat: Oropharynx is clear and moist. No oropharyngeal exudate.  Eyes: Conjunctivae and EOM are normal. Pupils are equal, round, and reactive to light. Right eye exhibits no discharge. Left eye exhibits no  discharge. No scleral icterus.  Neck: Neck supple. No JVD present. No tracheal deviation present. No thyromegaly present.  Cardiovascular: Normal rate, regular rhythm, normal heart sounds and intact distal pulses.  Exam reveals no gallop and no friction rub.   No murmur heard. Pulmonary/Chest: Effort normal and breath sounds normal. No respiratory distress. He has no wheezes. He has no rales. He exhibits no tenderness.  Abdominal: Soft. Bowel sounds are normal. He exhibits no distension and no mass. There is no tenderness. There is no rebound and no guarding.  Genitourinary: Rectum normal, prostate normal and penis normal. Guaiac negative stool. No penile tenderness.  Musculoskeletal: Normal range of motion. He exhibits no edema or tenderness.  Lymphadenopathy:    He has no cervical adenopathy.  Neurological: He is alert and oriented to person, place, and time. He has normal reflexes. No cranial nerve deficit. He exhibits normal muscle tone. Coordination normal.  Skin: Skin is warm and dry. No rash noted. He is not diaphoretic. No erythema. No pallor.  Psychiatric: He has a normal mood and affect. His behavior is normal. Judgment and thought content normal.          Assessment & Plan:  Well exam. We discussed diet and exercise. He is doing well except for these dizzy spells which still elude a definitive diagnosis. In my opinion he remains totally unable to work and he remains disabled, and this disability is permament.

## 2015-08-14 NOTE — Progress Notes (Signed)
Pre visit review using our clinic review tool, if applicable. No additional management support is needed unless otherwise documented below in the visit note. 

## 2015-08-15 ENCOUNTER — Encounter: Payer: BLUE CROSS/BLUE SHIELD | Admitting: Family Medicine

## 2015-08-17 ENCOUNTER — Ambulatory Visit: Payer: BLUE CROSS/BLUE SHIELD | Admitting: Family Medicine

## 2015-08-20 ENCOUNTER — Other Ambulatory Visit: Payer: Self-pay

## 2015-08-20 MED ORDER — RANITIDINE HCL 150 MG PO TABS: 150.0000 mg | ORAL_TABLET | Freq: Every day | ORAL | Status: DC

## 2015-08-24 ENCOUNTER — Ambulatory Visit: Payer: BLUE CROSS/BLUE SHIELD | Admitting: Family Medicine

## 2015-09-13 ENCOUNTER — Ambulatory Visit
Admission: RE | Admit: 2015-09-13 | Discharge: 2015-09-13 | Disposition: A | Discharge status: Home / Self Care | Payer: BLUE CROSS/BLUE SHIELD | Source: Ambulatory Visit | Attending: Allergy and Immunology | Admitting: Allergy and Immunology

## 2015-09-13 ENCOUNTER — Other Ambulatory Visit: Payer: Self-pay | Admitting: Allergy and Immunology

## 2015-09-13 DIAGNOSIS — J329 Chronic sinusitis, unspecified: Secondary | ICD-10-CM

## 2015-09-14 ENCOUNTER — Other Ambulatory Visit: Payer: Self-pay

## 2015-09-14 MED ORDER — RANITIDINE HCL 150 MG PO TABS: 150.0000 mg | ORAL_TABLET | Freq: Every day | ORAL | Status: DC

## 2015-10-04 ENCOUNTER — Telehealth: Payer: Self-pay | Admitting: General Practice

## 2015-10-04 ENCOUNTER — Other Ambulatory Visit: Payer: Self-pay | Admitting: General Practice

## 2015-10-04 MED ORDER — DIAZEPAM 5 MG PO TABS: ORAL_TABLET | ORAL | Status: DC

## 2015-10-04 NOTE — Telephone Encounter (Signed)
Call in #[phone] with 2 rf 

## 2015-10-05 MED ORDER — DIAZEPAM 5 MG PO TABS: ORAL_TABLET | ORAL | Status: DC

## 2015-10-05 NOTE — Telephone Encounter (Signed)
rx called in

## 2015-10-05 NOTE — Addendum Note (Signed)
Addended by: Westley Hummer B on: 10/05/2015 02:55 PM   Modules accepted: Orders

## 2015-12-07 ENCOUNTER — Telehealth: Payer: Self-pay | Admitting: *Deleted

## 2015-12-07 NOTE — Telephone Encounter (Signed)
Pt records faxed to Dr. Mathews Robinsons on 12/07/2015. [phone]

## 2016-02-15 ENCOUNTER — Ambulatory Visit (INDEPENDENT_AMBULATORY_CARE_PROVIDER_SITE_OTHER): Payer: BLUE CROSS/BLUE SHIELD | Admitting: Family Medicine

## 2016-02-15 ENCOUNTER — Encounter: Payer: Self-pay | Admitting: Family Medicine

## 2016-02-15 VITALS — BP 102/56 | HR 60 | Temp 98.7°F | Ht 71.0 in | Wt 184.0 lb

## 2016-02-15 DIAGNOSIS — Z23 Encounter for immunization: Secondary | ICD-10-CM | POA: Diagnosis not present

## 2016-02-15 DIAGNOSIS — K4090 Unilateral inguinal hernia, without obstruction or gangrene, not specified as recurrent: Secondary | ICD-10-CM

## 2016-02-15 DIAGNOSIS — D1720 Benign lipomatous neoplasm of skin and subcutaneous tissue of unspecified limb: Secondary | ICD-10-CM | POA: Diagnosis not present

## 2016-02-15 NOTE — Progress Notes (Signed)
Pre visit review using our clinic review tool, if applicable. No additional management support is needed unless otherwise documented below in the visit note. 

## 2016-02-15 NOTE — Progress Notes (Signed)
   Subjective:    Patient ID: Wesley [name], male    DOB: [birth year], 60 y.o.   MRN: [id number]  HPI Here for advice on a few things. 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 It is not growing and it does not bother him. Also he has had intermittent swelling and pain in the right groin for the past 2-3 years. No trouble with urinating or BMs.    Review of Systems  Constitutional: Negative.   Respiratory: Negative.   Cardiovascular: Negative.   Gastrointestinal: Negative.   Genitourinary: Negative.        Objective:   Physical Exam  Constitutional: He appears well-developed and well-nourished.  Neck: No thyromegaly present.  Cardiovascular: Normal rate, regular rhythm, normal heart sounds and intact distal pulses.   Pulmonary/Chest: Effort normal and breath sounds normal.  Abdominal: Soft. Bowel sounds are normal. He exhibits no distension. There is no rebound and no guarding.  Right groin has a small slightly tender reducible direct inguinal hernia  Musculoskeletal:  Small firm mobile non-tender lump in the anterior left axilla. No nodes felt   Lymphadenopathy:    He has no cervical adenopathy.          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 He has a small right inguinal hernia that will need to be repaired at some point. Now however he is busy finding his father, who lives in [place], an assisted living facility to live in. I think he can delay any surgery for 3-6 months unless the pain gets worse. Recheck prn.

## 2016-03-21 ENCOUNTER — Other Ambulatory Visit: Payer: Self-pay | Admitting: Family Medicine

## 2016-04-14 ENCOUNTER — Telehealth: Payer: Self-pay | Admitting: Family Medicine

## 2016-04-14 DIAGNOSIS — K4090 Unilateral inguinal hernia, without obstruction or gangrene, not specified as recurrent: Secondary | ICD-10-CM

## 2016-04-14 NOTE — Telephone Encounter (Signed)
Referral was done  

## 2016-04-14 NOTE — Telephone Encounter (Signed)
PT Lives in [place] now and needs a referral to dr Alfonse Spruce general surgery [phone] and fax [phone]. [name] Pt has an appt on 05/13/16 consultation and ct scan on 05/12/16 . Pt will be having hernia repair

## 2016-04-15 NOTE — Telephone Encounter (Signed)
I left a voice message for pt with below information.  

## 2016-04-21 ENCOUNTER — Telehealth: Payer: Self-pay | Admitting: Family Medicine

## 2016-04-21 DIAGNOSIS — K4090 Unilateral inguinal hernia, without obstruction or gangrene, not specified as recurrent: Secondary | ICD-10-CM

## 2016-04-21 NOTE — Telephone Encounter (Addendum)
Pt is having hernia surgery and having consult with a dr at [facility] on 12/19. Dr states they need pt to have a CT scan prior to this appointment.  Pt would like a referral for the CT scan to have done at [facility] on 12/18. Would like you to fax to referral to:  [phone]  Pt aware Dr Sarajane Jews is out today.  This was a different fax number than what is on referral that was done 11/21. Left a message for pt to verify with the dr office that they received

## 2016-04-22 NOTE — Telephone Encounter (Signed)
I assume this means a CT of abdomen and pelvis. Please have the patient find out if the surgeon wants this with contrast or without contrast.

## 2016-04-22 NOTE — Telephone Encounter (Signed)
Pt calling to check the status of the referral. °

## 2016-04-23 NOTE — Telephone Encounter (Signed)
Pt states surgeon wants CT of abdomen and pelvis with and without contrast.  Pt states only 2 slots left on the 18th, so needs to have asap.

## 2016-04-23 NOTE — Telephone Encounter (Signed)
The order was entered.

## 2016-04-24 NOTE — Telephone Encounter (Signed)
I left a voice message with below information. 

## 2016-05-22 ENCOUNTER — Encounter: Payer: Self-pay | Admitting: Family Medicine

## 2016-06-05 ENCOUNTER — Ambulatory Visit (INDEPENDENT_AMBULATORY_CARE_PROVIDER_SITE_OTHER): Payer: BLUE CROSS/BLUE SHIELD | Admitting: Family Medicine

## 2016-06-05 ENCOUNTER — Encounter: Payer: Self-pay | Admitting: Family Medicine

## 2016-06-05 VITALS — BP 100/60 | HR 56 | Temp 98.2°F | Ht 71.0 in | Wt 188.0 lb

## 2016-06-05 DIAGNOSIS — Z0000 Encounter for general adult medical examination without abnormal findings: Secondary | ICD-10-CM | POA: Diagnosis not present

## 2016-06-05 DIAGNOSIS — Z01818 Encounter for other preprocedural examination: Secondary | ICD-10-CM | POA: Diagnosis not present

## 2016-06-05 DIAGNOSIS — K4090 Unilateral inguinal hernia, without obstruction or gangrene, not specified as recurrent: Secondary | ICD-10-CM

## 2016-06-05 DIAGNOSIS — R739 Hyperglycemia, unspecified: Secondary | ICD-10-CM

## 2016-06-05 LAB — POC URINALSYSI DIPSTICK (AUTOMATED)
BILIRUBIN UA: NEGATIVE
GLUCOSE UA: NEGATIVE
Ketones, UA: NEGATIVE
Leukocytes, UA: NEGATIVE
Nitrite, UA: NEGATIVE
PH UA: 7
Protein, UA: NEGATIVE
RBC UA: NEGATIVE
Spec Grav, UA: 1.02
Urobilinogen, UA: 0.2

## 2016-06-05 LAB — CBC WITH DIFFERENTIAL/PLATELET
BASOS ABS: 0 10*3/uL (ref 0.0–0.1)
BASOS PCT: 0.6 % (ref 0.0–3.0)
EOS ABS: 0.8 10*3/uL — AB (ref 0.0–0.7)
Eosinophils Relative: 11.2 % — ABNORMAL HIGH (ref 0.0–5.0)
HEMATOCRIT: 43.8 % (ref 39.0–52.0)
Hemoglobin: 15 g/dL (ref 13.0–17.0)
LYMPHS ABS: 2.2 10*3/uL (ref 0.7–4.0)
LYMPHS PCT: 33.3 % (ref 12.0–46.0)
MCHC: 34.1 g/dL (ref 30.0–36.0)
MCV: 88.3 fl (ref 78.0–100.0)
MONO ABS: 0.6 10*3/uL (ref 0.1–1.0)
Monocytes Relative: 9.6 % (ref 3.0–12.0)
NEUTROS ABS: 3 10*3/uL (ref 1.4–7.7)
NEUTROS PCT: 45.3 % (ref 43.0–77.0)
PLATELETS: 301 10*3/uL (ref 150.0–400.0)
RBC: 4.96 Mil/uL (ref 4.22–5.81)
RDW: 13.3 % (ref 11.5–15.5)
WBC: 6.7 10*3/uL (ref 4.0–10.5)

## 2016-06-05 LAB — PROTIME-INR
INR: 1.2 ratio — AB (ref 0.8–1.0)
Prothrombin Time: 12.2 s (ref 9.6–13.1)

## 2016-06-05 LAB — BASIC METABOLIC PANEL
BUN: 20 mg/dL (ref 6–23)
CALCIUM: 9.5 mg/dL (ref 8.4–10.5)
CO2: 32 mEq/L (ref 19–32)
Chloride: 105 mEq/L (ref 96–112)
Creatinine, Ser: 1.02 mg/dL (ref 0.40–1.50)
GFR: 79.61 mL/min (ref >60.00)
GLUCOSE: 77 mg/dL (ref 70–99)
POTASSIUM: 4.6 meq/L (ref 3.5–5.1)
Sodium: 143 mEq/L (ref 135–145)

## 2016-06-05 LAB — HEPATIC FUNCTION PANEL
ALBUMIN: 4.2 g/dL (ref 3.5–5.2)
ALT: 17 U/L (ref 0–53)
AST: 18 U/L (ref 0–37)
Alkaline Phosphatase: 41 U/L (ref 39–117)
Bilirubin, Direct: 0.1 mg/dL (ref 0.0–0.3)
TOTAL PROTEIN: 6.6 g/dL (ref 6.0–8.3)
Total Bilirubin: 0.5 mg/dL (ref 0.2–1.2)

## 2016-06-05 LAB — APTT: aPTT: 28.4 s (ref 23.4–32.7)

## 2016-06-05 LAB — HEMOGLOBIN A1C: Hgb A1c MFr Bld: 5.2 % (ref 4.6–6.5)

## 2016-06-05 LAB — TSH: TSH: 1.53 u[IU]/mL (ref 0.35–4.50)

## 2016-06-05 LAB — PSA: PSA: 3.55 ng/mL (ref 0.10–4.00)

## 2016-06-05 NOTE — Progress Notes (Signed)
   Subjective:    Patient ID: Wesley [name], male    DOB: Sep 12, 1955, 61 y.o.   MRN: [id number]  HPI 61 yr old male for a presurgical evaluation. He is scheduled for repair of a right inguinal hernia on 06-30-16 at [facility] with Dr. Irven Shelling. Other than occasional pain form the hernia he has been doing well. He has occasional mild spells of vertigo but this is much better than it was a year ago. He remains out of work. No other issues.    Review of Systems  Constitutional: Negative.   HENT: Negative.   Eyes: Negative.   Respiratory: Negative.   Cardiovascular: Negative.   Gastrointestinal: Positive for abdominal pain. Negative for abdominal distention, anal bleeding, blood in stool, constipation, diarrhea, nausea, rectal pain and vomiting.  Genitourinary: Negative.   Musculoskeletal: Negative.   Skin: Negative.   Neurological: Positive for dizziness. Negative for tremors, seizures, syncope, facial asymmetry, speech difficulty, weakness, light-headedness, numbness and headaches.  Psychiatric/Behavioral: Negative.        Objective:   Physical Exam  Constitutional: He is oriented to person, place, and time. He appears well-developed and well-nourished. No distress.  HENT:  Head: Normocephalic and atraumatic.  Right Ear: External ear normal.  Left Ear: External ear normal.  Nose: Nose normal.  Mouth/Throat: Oropharynx is clear and moist. No oropharyngeal exudate.  Eyes: Conjunctivae and EOM are normal. Pupils are equal, round, and reactive to light. Right eye exhibits no discharge. Left eye exhibits no discharge. No scleral icterus.  Neck: Neck supple. No JVD present. No tracheal deviation present. No thyromegaly present.  Cardiovascular: Normal rate, regular rhythm, normal heart sounds and intact distal pulses.  Exam reveals no gallop and no friction rub.   No murmur heard. EKG shows slight sinus bradycardia but shows no change from his baseline  Pulmonary/Chest: Effort normal  and breath sounds normal. No respiratory distress. He has no wheezes. He has no rales. He exhibits no tenderness.  Abdominal: Soft. Bowel sounds are normal. He exhibits no distension. There is no tenderness. There is no rebound and no guarding.  There is a small non-tender reducible right direct inguinal hernia   Genitourinary: Rectum normal, prostate normal and penis normal. Rectal exam shows guaiac negative stool. No penile tenderness.  Musculoskeletal: Normal range of motion. He exhibits no edema or tenderness.  Lymphadenopathy:    He has no cervical adenopathy.  Neurological: He is alert and oriented to person, place, and time. He has normal reflexes. No cranial nerve deficit. He exhibits normal muscle tone. Coordination normal.  Skin: Skin is warm and dry. No rash noted. He is not diaphoretic. No erythema. No pallor.  Psychiatric: He has a normal mood and affect. His behavior is normal. Judgment and thought content normal.          Assessment & Plan:  He is scheduled for a right hernia repair. By history and exam he is doing well. We will send him for labs today. Pending these results I anticipate clearing him for surgery.  Alysia Penna, MD

## 2016-06-05 NOTE — Progress Notes (Signed)
Pre visit review using our clinic review tool, if applicable. No additional management support is needed unless otherwise documented below in the visit note. 

## 2016-06-16 ENCOUNTER — Telehealth: Payer: Self-pay | Admitting: Family Medicine

## 2016-06-16 NOTE — Telephone Encounter (Signed)
Per Dr Sarajane Jews pt must have a office visit for evaluation.

## 2016-06-16 NOTE — Telephone Encounter (Signed)
Called pt and he state that he will call back when he looks at his calendar.

## 2016-06-16 NOTE — Telephone Encounter (Signed)
° ° °  Pt said he has inner ear inflammation and is asking for prednisone which he thinks will help   [facility]   [address]  [place] [address]

## 2016-06-30 HISTORY — PX: INGUINAL HERNIA REPAIR: SUR1180

## 2016-08-19 ENCOUNTER — Encounter: Payer: Self-pay | Admitting: Family Medicine

## 2016-09-16 ENCOUNTER — Other Ambulatory Visit: Payer: Self-pay | Admitting: Gastroenterology

## 2016-10-13 ENCOUNTER — Other Ambulatory Visit: Payer: Self-pay | Admitting: Family Medicine

## 2016-10-14 ENCOUNTER — Other Ambulatory Visit: Payer: Self-pay | Admitting: Gastroenterology

## 2016-10-14 NOTE — Telephone Encounter (Signed)
Please Advise if okey to refill.

## 2016-10-14 NOTE — Telephone Encounter (Signed)
Yes that's okay. Give him 90 day supply with 3 refills if you can, and he can see me in clinic for follow up as needed. Thanks

## 2016-10-14 NOTE — Telephone Encounter (Signed)
Dr. Havery Moros, This pt has not been seen in clinic by you. You did perform egd last year. Just want to make sure you are ok with refill of Zantac. Thanks.

## 2016-10-26 IMAGING — CT CT MAXILLOFACIAL W/O CM
3 series · 14 of 47 positions shown, 16 images · non-contrast
Comparison: September 27, 2010

CLINICAL DATA: Chronic sinusitis and congestion

EXAM:
CT MAXILLOFACIAL WITHOUT CONTRAST
TECHNIQUE: Multidetector CT imaging of the maxillofacial structures was
performed. Multiplanar CT image reconstructions were also generated.
A small metallic BB was placed on the right temple in order to
reliably differentiate right from left.

[Series 3: maxofacial soft · axial · 0.34mm/px · z∈[-99,+9]mm · 8 of 42 slices shown, 10 images]
[im 3/42  brain]
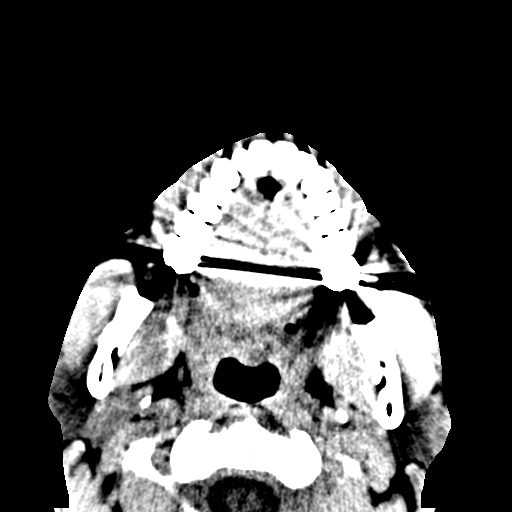
[im 3/42  bone]
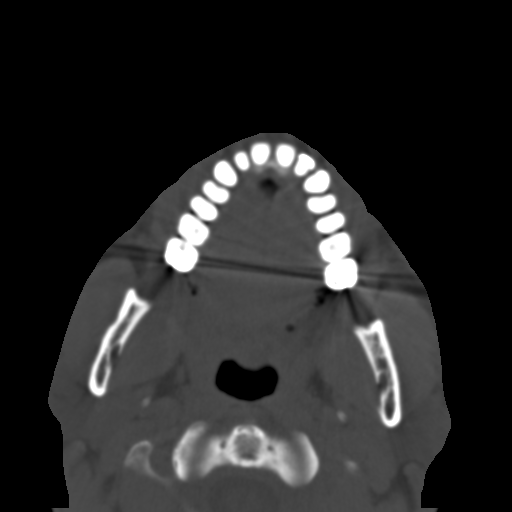
[im 9/42  bone]
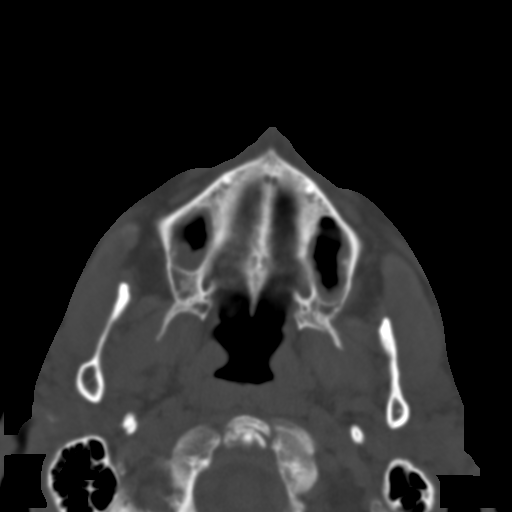
[im 13/42  bone]
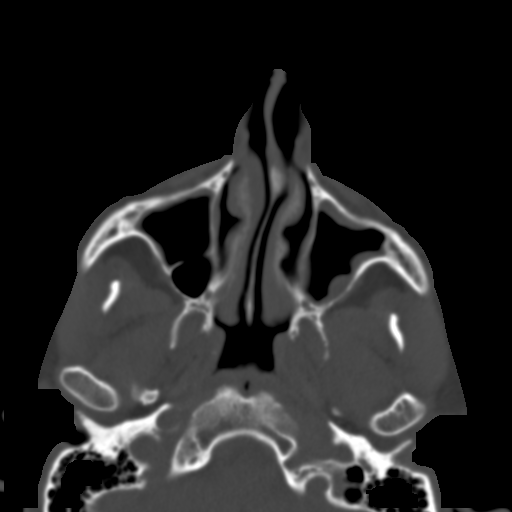
[im 19/42  bone]
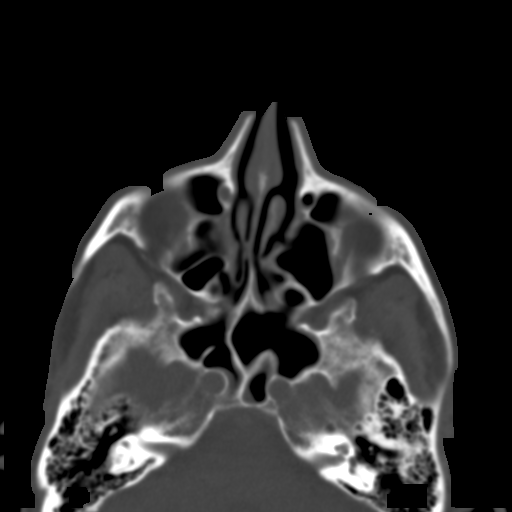
[im 23/42  brain]
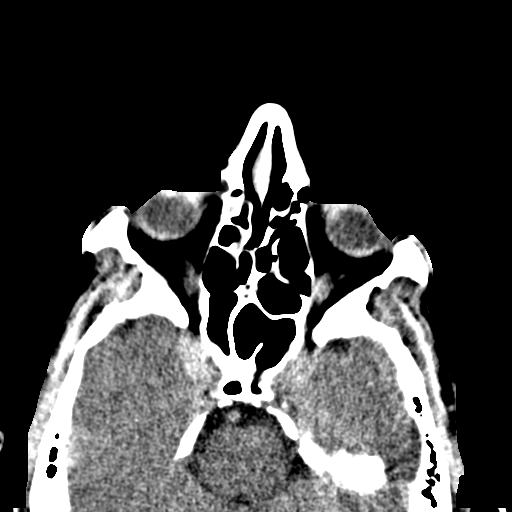
[im 23/42  bone]
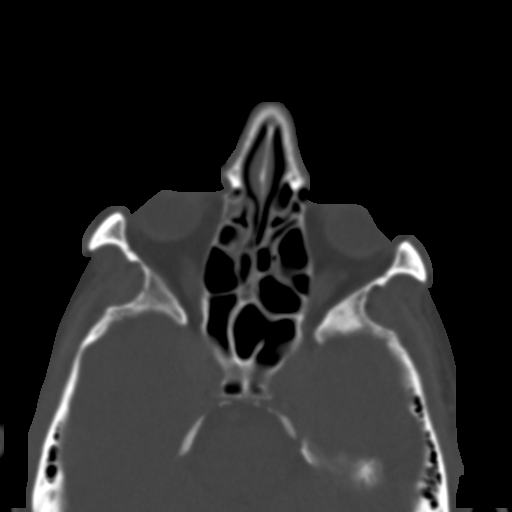
[im 29/42  bone]
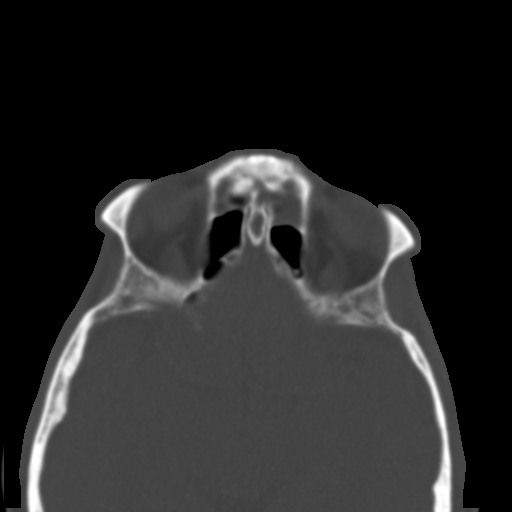
[im 33/42  bone]
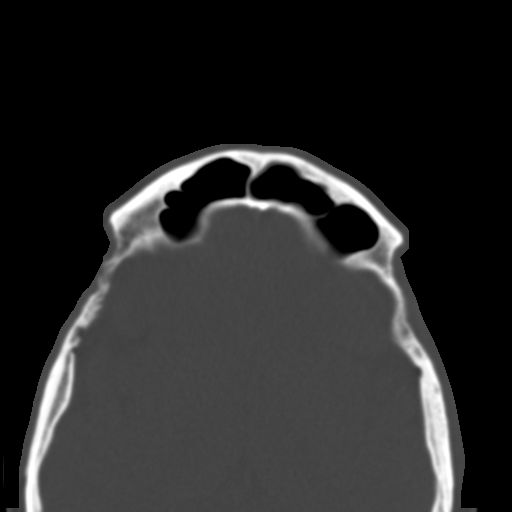
[im 39/42  bone]
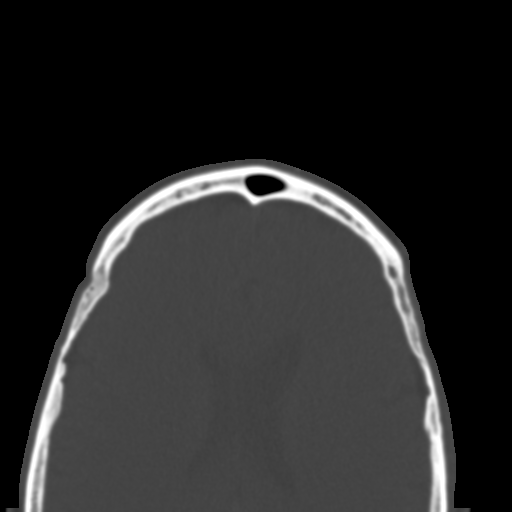

[Series 6: cor soft · coronal · 0.26mm/px · 3 of 82 slices shown]
[im 28/82  bone]
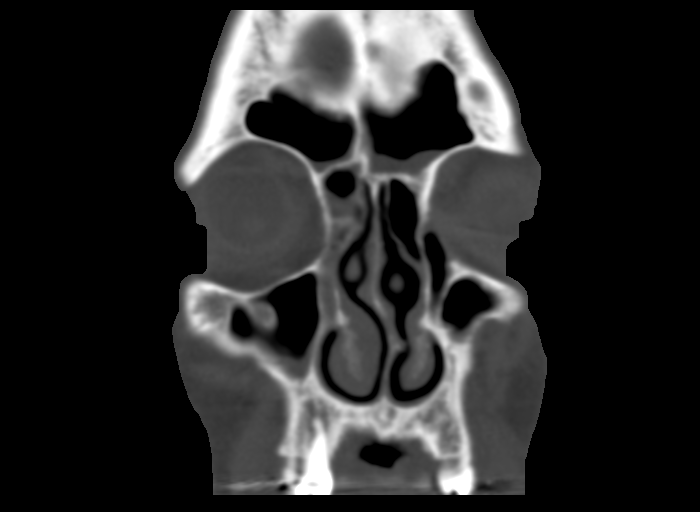
[im 37/82  bone]
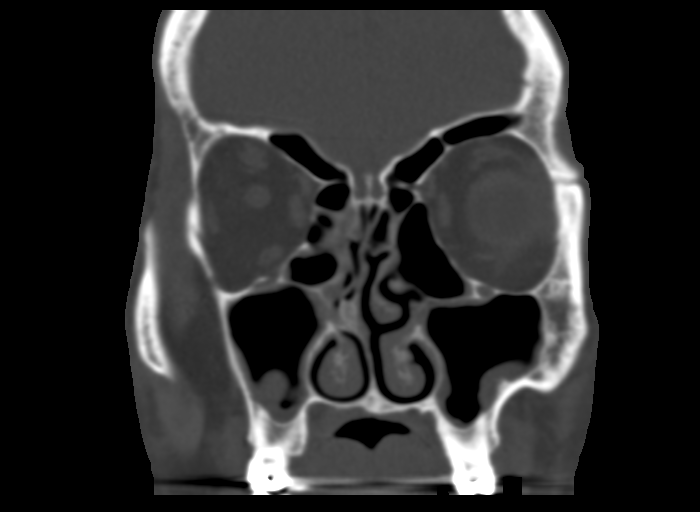
[im 46/82  bone]
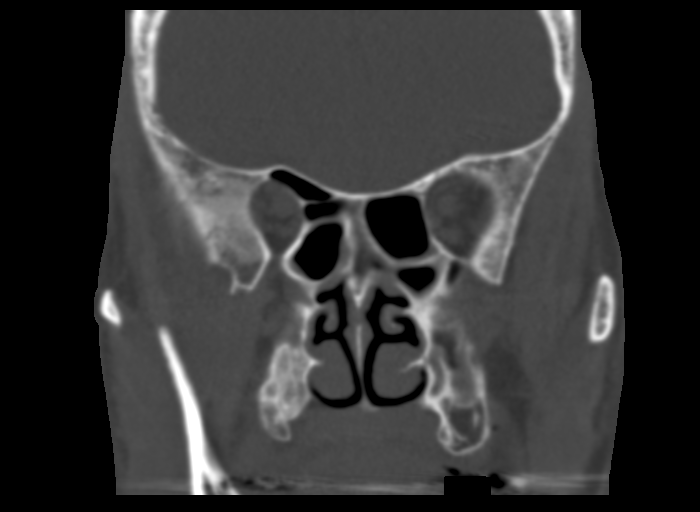

[Series 7: sag soft · sagittal · 0.25mm/px · 3 of 86 slices shown]
[im 29/86  bone]
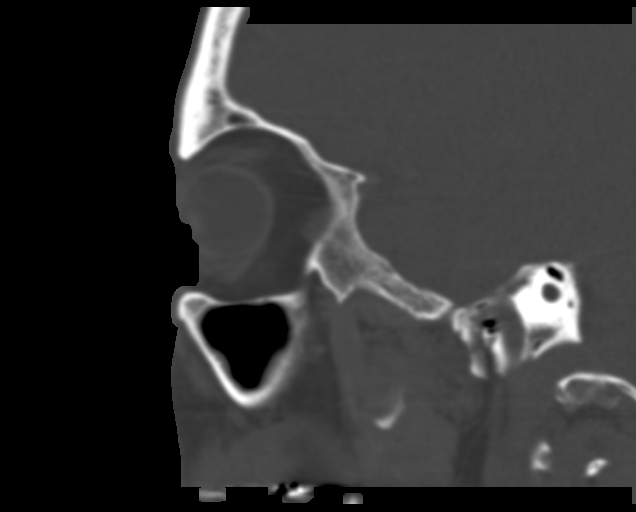
[im 43/86  bone]
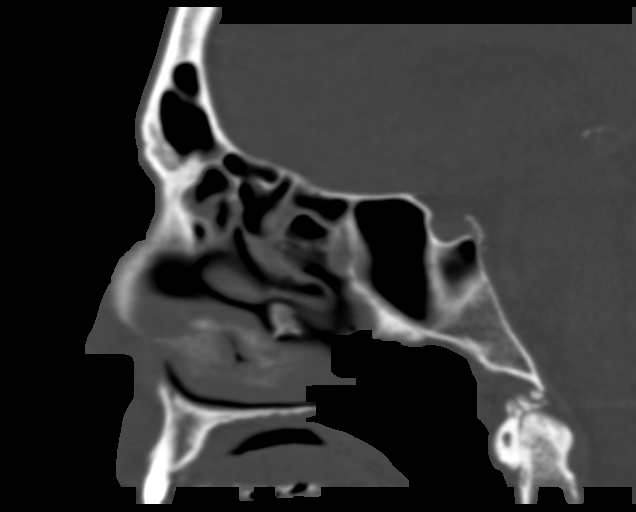
[im 57/86  bone]
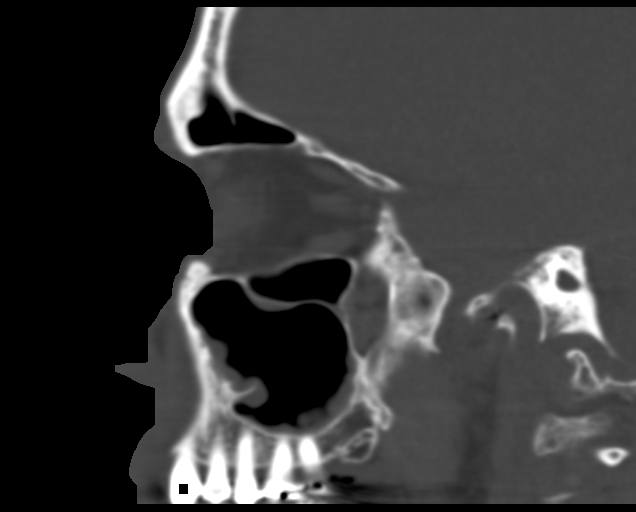

[14 of 47 positions shown; findings below may reference images not displayed]

FINDINGS: There is persistent mucoperiosteal thickening in both maxillary
antra and in multiple anterior ethmoid air cells bilaterally, not
appreciably changed from the prior study. There is also
mucoperiosteal thickening in the inferior aspect of each frontal
sinus, essentially unchanged on the right and minimally increased on
the left compared to prior study. There is mucosal thickening in
each inferior sphenoid sinus, marginally less on the right and
stable on the left compared to the previous study. There are no
air-fluid levels. No bony destruction or expansion. There is
ostiomeatal unit complex obstruction bilaterally, unchanged. There
is stable rightward deviation of the nasal septum. There is no nares
obstruction.

Orbits appear symmetric bilaterally. Visualized mastoid air cells
are clear. Visualized brain parenchyma appears unremarkable.
Visualize pharynx appears stable and unremarkable.
IMPRESSION: Mucoperiosteal thickening in multiple areas. There is essentially no
change compared to the 8748 study in the maxillary and ethmoid sinus
regions. Slight increase in mucoperiosteal thickening is noted in
the inferior left frontal sinus with slightly less mucoperiosteal
thickening in the right inferior sphenoid sinus. Otherwise no change
from the 8748 study. The degree of ostiomeatal unit obstruction is
stable bilaterally. There is no bony destruction or expansion. No
air-fluid levels. Nasal septal deviation is stable.

## 2017-08-17 ENCOUNTER — Encounter: Payer: BLUE CROSS/BLUE SHIELD | Admitting: Family Medicine

## 2017-09-04 ENCOUNTER — Encounter: Payer: BLUE CROSS/BLUE SHIELD | Admitting: Family Medicine

## 2017-09-14 ENCOUNTER — Encounter: Payer: BLUE CROSS/BLUE SHIELD | Admitting: Family Medicine

## 2017-10-02 ENCOUNTER — Ambulatory Visit (INDEPENDENT_AMBULATORY_CARE_PROVIDER_SITE_OTHER): Payer: BLUE CROSS/BLUE SHIELD | Admitting: Family Medicine

## 2017-10-02 ENCOUNTER — Encounter: Payer: Self-pay | Admitting: Family Medicine

## 2017-10-02 VITALS — BP 90/56 | HR 72 | Temp 98.4°F | Ht 71.0 in | Wt 189.0 lb

## 2017-10-02 DIAGNOSIS — Z23 Encounter for immunization: Secondary | ICD-10-CM

## 2017-10-02 DIAGNOSIS — Z0000 Encounter for general adult medical examination without abnormal findings: Secondary | ICD-10-CM

## 2017-10-02 NOTE — Progress Notes (Signed)
   Subjective:    Patient ID: [name], male    DOB: [birth year], 62 y.o.   MRN: [id number]  HPI Here for a well exam. He feels well in general. He lives in [place] but he gets to [place] frequently. He still has occasional bouts of vertigo but they are less frequent and less intense than before. A neurologist in [facility] put him on a diet that excludes any form of caffeine, red wine, chocolate, and some other items. He had some labs in [place] on 08-12-17 that were all normal, including a PSA of 3.0. He is worried because his brother has prostate cancer.    Review of Systems  Constitutional: Negative.   HENT: Negative.   Eyes: Negative.   Respiratory: Negative.   Cardiovascular: Negative.   Gastrointestinal: Negative.   Genitourinary: Negative.   Musculoskeletal: Negative.   Skin: Negative.   Neurological: Negative.   Psychiatric/Behavioral: Negative.        Objective:   Physical Exam  Constitutional: He is oriented to person, place, and time. He appears well-developed and well-nourished. No distress.  HENT:  Head: Normocephalic and atraumatic.  Right Ear: External ear normal.  Left Ear: External ear normal.  Nose: Nose normal.  Mouth/Throat: Oropharynx is clear and moist. No oropharyngeal exudate.  Eyes: Pupils are equal, round, and reactive to light. Conjunctivae and EOM are normal. Right eye exhibits no discharge. Left eye exhibits no discharge. No scleral icterus.  Neck: Neck supple. No JVD present. No tracheal deviation present. No thyromegaly present.  Cardiovascular: Normal rate, regular rhythm, normal heart sounds and intact distal pulses. Exam reveals no gallop and no friction rub.  No murmur heard. Pulmonary/Chest: Effort normal and breath sounds normal. No respiratory distress. He has no wheezes. He has no rales. He exhibits no tenderness.  Abdominal: Soft. Bowel sounds are normal. He exhibits no distension and no mass. There is no tenderness. There is  no rebound and no guarding.  Genitourinary: Rectum normal, prostate normal and penis normal. Rectal exam shows guaiac negative stool. No penile tenderness.  Musculoskeletal: Normal range of motion. He exhibits no edema or tenderness.  Lymphadenopathy:    He has no cervical adenopathy.  Neurological: He is alert and oriented to person, place, and time. He has normal reflexes. He displays normal reflexes. No cranial nerve deficit. He exhibits normal muscle tone. Coordination normal.  Skin: Skin is warm and dry. No rash noted. He is not diaphoretic. No erythema. No pallor.  Psychiatric: He has a normal mood and affect. His behavior is normal. Judgment and thought content normal.          Assessment & Plan:  Well exam. We discussed diet and exercise. He will get fasting labs sometime soon.  Alysia Penna, MD

## 2017-10-09 ENCOUNTER — Telehealth: Payer: Self-pay | Admitting: Family Medicine

## 2017-10-09 NOTE — Telephone Encounter (Signed)
Rx/forms faxed. Fax confirmation received.  

## 2017-10-09 NOTE — Telephone Encounter (Signed)
Copied from [place] [id number]. Topic: Inquiry >> Oct 09, 2017  2:06 PM Pricilla Handler wrote: Reason for CRM: Patient called stating that he needs a copy of his physical results and doctor's notes sent to his insurance company for his disability. Please fax information to [employer] - Fax # [phone]. Phn # [phone].        Thank You!!!

## 2017-10-27 ENCOUNTER — Telehealth: Payer: Self-pay | Admitting: Family Medicine

## 2017-10-27 NOTE — Telephone Encounter (Signed)
Copied from [place] [phone]. Topic: Quick Communication - See Telephone Encounter >> Oct 27, 2017  4:44 PM Cleaster Corin, NT wrote: CRM for notification. See Telephone encounter for: 10/27/17.  [facility] processing center requesting records for pt. [phone] ref. # [id number]

## 2017-11-27 ENCOUNTER — Telehealth: Payer: Self-pay | Admitting: Gastroenterology

## 2017-11-27 NOTE — Telephone Encounter (Signed)
Yes we can refill it it for him. I would like to see him back for a routine follow up at some point when he can do it if he wishes to continue to see [place] and get medication from [place]. If he lives in [place] permanently now his primary care can also manage this.

## 2017-11-27 NOTE — Telephone Encounter (Signed)
Previously on Ranitidine 150 mg daily. Last seen in this office 2017. He has seen his PCP this year. Should I refill and and set up an appointment or have him contact his PCP?

## 2017-11-30 ENCOUNTER — Other Ambulatory Visit: Payer: Self-pay

## 2017-11-30 MED ORDER — RANITIDINE HCL 150 MG PO TABS: 150.0000 mg | ORAL_TABLET | Freq: Two times a day (BID) | ORAL | 1 refills | Status: AC

## 2017-11-30 NOTE — Telephone Encounter (Signed)
Rx to requested pharmacy with message to schedule.

## 2017-12-17 ENCOUNTER — Other Ambulatory Visit: Payer: BLUE CROSS/BLUE SHIELD

## 2017-12-29 ENCOUNTER — Other Ambulatory Visit: Payer: Self-pay | Admitting: Family Medicine

## 2017-12-29 NOTE — Telephone Encounter (Signed)
Last OV 10/02/2017   Not on pt's current medication list   Sent to PCP to advise

## 2017-12-30 ENCOUNTER — Other Ambulatory Visit (INDEPENDENT_AMBULATORY_CARE_PROVIDER_SITE_OTHER): Payer: BLUE CROSS/BLUE SHIELD

## 2017-12-30 DIAGNOSIS — Z0000 Encounter for general adult medical examination without abnormal findings: Secondary | ICD-10-CM

## 2017-12-30 LAB — CBC WITH DIFFERENTIAL/PLATELET
BASOS ABS: 0 10*3/uL (ref 0.0–0.1)
BASOS PCT: 0.7 % (ref 0.0–3.0)
EOS ABS: 0.7 10*3/uL (ref 0.0–0.7)
Eosinophils Relative: 10.7 % — ABNORMAL HIGH (ref 0.0–5.0)
HEMATOCRIT: 43.5 % (ref 39.0–52.0)
HEMOGLOBIN: 14.9 g/dL (ref 13.0–17.0)
LYMPHS PCT: 36.4 % (ref 12.0–46.0)
Lymphs Abs: 2.3 10*3/uL (ref 0.7–4.0)
MCHC: 34.3 g/dL (ref 30.0–36.0)
MCV: 88.7 fl (ref 78.0–100.0)
MONO ABS: 0.5 10*3/uL (ref 0.1–1.0)
Monocytes Relative: 8.3 % (ref 3.0–12.0)
Neutro Abs: 2.7 10*3/uL (ref 1.4–7.7)
Neutrophils Relative %: 43.9 % (ref 43.0–77.0)
Platelets: 249 10*3/uL (ref 150.0–400.0)
RBC: 4.9 Mil/uL (ref 4.22–5.81)
RDW: 13.5 % (ref 11.5–15.5)
WBC: 6.2 10*3/uL (ref 4.0–10.5)

## 2017-12-30 LAB — HEPATIC FUNCTION PANEL
ALBUMIN: 4 g/dL (ref 3.5–5.2)
ALK PHOS: 37 U/L — AB (ref 39–117)
ALT: 18 U/L (ref 0–53)
AST: 16 U/L (ref 0–37)
Bilirubin, Direct: 0.1 mg/dL (ref 0.0–0.3)
TOTAL PROTEIN: 6 g/dL (ref 6.0–8.3)
Total Bilirubin: 0.5 mg/dL (ref 0.2–1.2)

## 2017-12-30 LAB — BASIC METABOLIC PANEL
BUN: 23 mg/dL (ref 6–23)
CALCIUM: 9.2 mg/dL (ref 8.4–10.5)
CHLORIDE: 106 meq/L (ref 96–112)
CO2: 29 mEq/L (ref 19–32)
CREATININE: 1.07 mg/dL (ref 0.40–1.50)
GFR: 74.93 mL/min (ref >60.00)
Glucose, Bld: 104 mg/dL — ABNORMAL HIGH (ref 70–99)
Potassium: 4.3 mEq/L (ref 3.5–5.1)
Sodium: 140 mEq/L (ref 135–145)

## 2017-12-30 LAB — LIPID PANEL
CHOLESTEROL: 144 mg/dL (ref 0–200)
HDL: 57.3 mg/dL (ref >39.00)
LDL CALC: 71 mg/dL (ref 0–99)
NonHDL: 87.13
Total CHOL/HDL Ratio: 3
Triglycerides: 80 mg/dL (ref 0.0–149.0)
VLDL: 16 mg/dL (ref 0.0–40.0)

## 2017-12-30 LAB — TSH: TSH: 1.9 u[IU]/mL (ref 0.35–4.50)

## 2017-12-30 LAB — POC URINALSYSI DIPSTICK (AUTOMATED)
Bilirubin, UA: NEGATIVE
Blood, UA: NEGATIVE
Glucose, UA: NEGATIVE
Ketones, UA: NEGATIVE
LEUKOCYTES UA: NEGATIVE
NITRITE UA: NEGATIVE
PROTEIN UA: NEGATIVE
SPEC GRAV UA: 1.025 (ref 1.010–1.025)
UROBILINOGEN UA: 0.2 U/dL
pH, UA: 6 (ref 5.0–8.0)

## 2017-12-30 LAB — PSA: PSA: 3.38 ng/mL (ref 0.10–4.00)

## 2018-04-27 ENCOUNTER — Telehealth: Payer: Self-pay | Admitting: Gastroenterology

## 2018-04-27 NOTE — Telephone Encounter (Signed)
Do you know a GI in [place]?

## 2018-04-27 NOTE — Telephone Encounter (Signed)
[name] has a very good GI department, I would recommend them, no specific doc

## 2018-04-27 NOTE — Telephone Encounter (Signed)
Detailed message left on VM about recommendations

## 2018-06-01 ENCOUNTER — Encounter: Payer: BLUE CROSS/BLUE SHIELD | Admitting: Family Medicine

## 2018-07-23 ENCOUNTER — Encounter: Payer: Self-pay | Admitting: Gastroenterology

## 2019-03-01 ENCOUNTER — Other Ambulatory Visit: Payer: Self-pay | Admitting: Family Medicine

## 2019-03-02 NOTE — Telephone Encounter (Signed)
Okay to refill? 

## 2020-06-22 ENCOUNTER — Other Ambulatory Visit: Payer: Self-pay | Admitting: Family Medicine
